# Patient Record
Sex: Male | Born: 1945 | Race: White | Hispanic: No | Marital: Married | Smoking: Never smoker
Health system: Southern US, Community
[De-identification: ages and names within clinical notes are randomized; demographics above are authoritative.]

## PROBLEM LIST (undated history)

## (undated) DIAGNOSIS — E785 Hyperlipidemia, unspecified: Secondary | ICD-10-CM

## (undated) DIAGNOSIS — Z9889 Other specified postprocedural states: Secondary | ICD-10-CM

## (undated) DIAGNOSIS — I10 Essential (primary) hypertension: Secondary | ICD-10-CM

## (undated) DIAGNOSIS — I2510 Atherosclerotic heart disease of native coronary artery without angina pectoris: Secondary | ICD-10-CM

## (undated) DIAGNOSIS — M545 Low back pain, unspecified: Secondary | ICD-10-CM

## (undated) DIAGNOSIS — R112 Nausea with vomiting, unspecified: Secondary | ICD-10-CM

## (undated) DIAGNOSIS — M1990 Unspecified osteoarthritis, unspecified site: Secondary | ICD-10-CM

## (undated) DIAGNOSIS — R7303 Prediabetes: Secondary | ICD-10-CM

## (undated) DIAGNOSIS — N529 Male erectile dysfunction, unspecified: Secondary | ICD-10-CM

## (undated) HISTORY — PX: TONSILLECTOMY: SUR1361

## (undated) HISTORY — PX: INGUINAL HERNIA REPAIR: SUR1180

## (undated) HISTORY — DX: Essential (primary) hypertension: I10

## (undated) HISTORY — PX: CORONARY ARTERY BYPASS GRAFT: SHX141

## (undated) HISTORY — DX: Male erectile dysfunction, unspecified: N52.9

## (undated) HISTORY — DX: Atherosclerotic heart disease of native coronary artery without angina pectoris: I25.10

## (undated) HISTORY — PX: LUMBAR DISC SURGERY: SHX700

## (undated) HISTORY — PX: CARDIAC SURGERY: SHX584

---

## 2001-06-23 ENCOUNTER — Ambulatory Visit (HOSPITAL_COMMUNITY): Admission: RE | Admit: 2001-06-23 | Discharge: 2001-06-24 | Payer: Self-pay | Admitting: Cardiology

## 2001-07-11 ENCOUNTER — Encounter (HOSPITAL_COMMUNITY): Admission: RE | Admit: 2001-07-11 | Discharge: 2001-10-09 | Payer: Self-pay | Admitting: Cardiology

## 2001-10-09 ENCOUNTER — Inpatient Hospital Stay (HOSPITAL_COMMUNITY): Admission: EM | Admit: 2001-10-09 | Discharge: 2001-10-18 | Payer: Self-pay | Admitting: Emergency Medicine

## 2001-10-11 ENCOUNTER — Encounter: Payer: Self-pay | Admitting: Cardiothoracic Surgery

## 2001-10-12 ENCOUNTER — Encounter: Payer: Self-pay | Admitting: Cardiothoracic Surgery

## 2001-10-13 ENCOUNTER — Encounter: Payer: Self-pay | Admitting: Cardiothoracic Surgery

## 2001-10-14 ENCOUNTER — Encounter: Payer: Self-pay | Admitting: Cardiothoracic Surgery

## 2001-10-15 ENCOUNTER — Encounter: Payer: Self-pay | Admitting: Cardiothoracic Surgery

## 2001-10-17 ENCOUNTER — Encounter: Payer: Self-pay | Admitting: Cardiothoracic Surgery

## 2001-11-28 ENCOUNTER — Encounter (HOSPITAL_COMMUNITY): Admission: RE | Admit: 2001-11-28 | Discharge: 2002-02-26 | Payer: Self-pay | Admitting: Cardiology

## 2004-10-08 ENCOUNTER — Encounter: Admission: RE | Admit: 2004-10-08 | Discharge: 2004-10-08 | Payer: Self-pay | Admitting: Internal Medicine

## 2004-11-02 ENCOUNTER — Ambulatory Visit (HOSPITAL_COMMUNITY): Admission: RE | Admit: 2004-11-02 | Discharge: 2004-11-02 | Payer: Self-pay | Admitting: Neurological Surgery

## 2012-07-12 ENCOUNTER — Emergency Department (HOSPITAL_COMMUNITY): Payer: Medicare Other

## 2012-07-12 ENCOUNTER — Inpatient Hospital Stay (HOSPITAL_COMMUNITY)
Admission: EM | Admit: 2012-07-12 | Discharge: 2012-07-18 | DRG: 551 | Disposition: A | Discharge status: Rehabilitation Facility | Payer: Medicare Other | Attending: General Surgery | Admitting: General Surgery

## 2012-07-12 ENCOUNTER — Encounter (HOSPITAL_COMMUNITY): Payer: Self-pay

## 2012-07-12 DIAGNOSIS — W19XXXA Unspecified fall, initial encounter: Secondary | ICD-10-CM

## 2012-07-12 DIAGNOSIS — S2249XA Multiple fractures of ribs, unspecified side, initial encounter for closed fracture: Secondary | ICD-10-CM | POA: Diagnosis present

## 2012-07-12 DIAGNOSIS — S066X9A Traumatic subarachnoid hemorrhage with loss of consciousness of unspecified duration, initial encounter: Secondary | ICD-10-CM | POA: Diagnosis present

## 2012-07-12 DIAGNOSIS — S065XAA Traumatic subdural hemorrhage with loss of consciousness status unknown, initial encounter: Secondary | ICD-10-CM | POA: Diagnosis present

## 2012-07-12 DIAGNOSIS — S066XAA Traumatic subarachnoid hemorrhage with loss of consciousness status unknown, initial encounter: Secondary | ICD-10-CM | POA: Diagnosis present

## 2012-07-12 DIAGNOSIS — S0291XA Unspecified fracture of skull, initial encounter for closed fracture: Secondary | ICD-10-CM

## 2012-07-12 DIAGNOSIS — S27329A Contusion of lung, unspecified, initial encounter: Secondary | ICD-10-CM

## 2012-07-12 DIAGNOSIS — I609 Nontraumatic subarachnoid hemorrhage, unspecified: Secondary | ICD-10-CM

## 2012-07-12 DIAGNOSIS — I2510 Atherosclerotic heart disease of native coronary artery without angina pectoris: Secondary | ICD-10-CM | POA: Diagnosis present

## 2012-07-12 DIAGNOSIS — S32029A Unspecified fracture of second lumbar vertebra, initial encounter for closed fracture: Secondary | ICD-10-CM | POA: Diagnosis present

## 2012-07-12 DIAGNOSIS — K560 Paralytic ileus: Secondary | ICD-10-CM | POA: Diagnosis present

## 2012-07-12 DIAGNOSIS — S42102A Fracture of unspecified part of scapula, left shoulder, initial encounter for closed fracture: Secondary | ICD-10-CM | POA: Diagnosis present

## 2012-07-12 DIAGNOSIS — S065X9A Traumatic subdural hemorrhage with loss of consciousness of unspecified duration, initial encounter: Secondary | ICD-10-CM | POA: Diagnosis present

## 2012-07-12 DIAGNOSIS — S2242XA Multiple fractures of ribs, left side, initial encounter for closed fracture: Secondary | ICD-10-CM | POA: Diagnosis present

## 2012-07-12 DIAGNOSIS — S42109A Fracture of unspecified part of scapula, unspecified shoulder, initial encounter for closed fracture: Secondary | ICD-10-CM | POA: Diagnosis present

## 2012-07-12 DIAGNOSIS — J939 Pneumothorax, unspecified: Secondary | ICD-10-CM

## 2012-07-12 DIAGNOSIS — W11XXXA Fall on and from ladder, initial encounter: Secondary | ICD-10-CM | POA: Diagnosis present

## 2012-07-12 DIAGNOSIS — E785 Hyperlipidemia, unspecified: Secondary | ICD-10-CM | POA: Diagnosis present

## 2012-07-12 DIAGNOSIS — S22009A Unspecified fracture of unspecified thoracic vertebra, initial encounter for closed fracture: Secondary | ICD-10-CM

## 2012-07-12 DIAGNOSIS — S22089A Unspecified fracture of T11-T12 vertebra, initial encounter for closed fracture: Secondary | ICD-10-CM | POA: Diagnosis present

## 2012-07-12 DIAGNOSIS — S32019A Unspecified fracture of first lumbar vertebra, initial encounter for closed fracture: Secondary | ICD-10-CM | POA: Diagnosis present

## 2012-07-12 DIAGNOSIS — S066X0A Traumatic subarachnoid hemorrhage without loss of consciousness, initial encounter: Secondary | ICD-10-CM | POA: Diagnosis present

## 2012-07-12 DIAGNOSIS — J942 Hemothorax: Secondary | ICD-10-CM

## 2012-07-12 DIAGNOSIS — I10 Essential (primary) hypertension: Secondary | ICD-10-CM | POA: Diagnosis present

## 2012-07-12 DIAGNOSIS — S32009A Unspecified fracture of unspecified lumbar vertebra, initial encounter for closed fracture: Secondary | ICD-10-CM

## 2012-07-12 HISTORY — DX: Low back pain, unspecified: M54.50

## 2012-07-12 HISTORY — DX: Essential (primary) hypertension: I10

## 2012-07-12 HISTORY — DX: Low back pain: M54.5

## 2012-07-12 HISTORY — DX: Hyperlipidemia, unspecified: E78.5

## 2012-07-12 LAB — CBC WITH DIFFERENTIAL/PLATELET
Basophils Absolute: 0 10*3/uL (ref 0.0–0.1)
Basophils Relative: 0 % (ref 0–1)
Eosinophils Absolute: 0.1 10*3/uL (ref 0.0–0.7)
Eosinophils Relative: 1 % (ref 0–5)
HCT: 39.1 % (ref 39.0–52.0)
Hemoglobin: 13.5 g/dL (ref 13.0–17.0)
Lymphocytes Relative: 16 % (ref 12–46)
Lymphs Abs: 2.4 10*3/uL (ref 0.7–4.0)
MCH: 31.1 pg (ref 26.0–34.0)
MCHC: 34.5 g/dL (ref 30.0–36.0)
MCV: 90.1 fL (ref 78.0–100.0)
Monocytes Absolute: 0.7 10*3/uL (ref 0.1–1.0)
Monocytes Relative: 5 % (ref 3–12)
Neutro Abs: 11.3 10*3/uL — ABNORMAL HIGH (ref 1.7–7.7)
Neutrophils Relative %: 78 % — ABNORMAL HIGH (ref 43–77)
Platelets: 244 10*3/uL (ref 150–400)
RBC: 4.34 MIL/uL (ref 4.22–5.81)
RDW: 12.9 % (ref 11.5–15.5)
WBC: 14.5 10*3/uL — ABNORMAL HIGH (ref 4.0–10.5)

## 2012-07-12 LAB — POCT I-STAT, CHEM 8
BUN: 13 mg/dL (ref 6–23)
Calcium, Ion: 1.21 mmol/L (ref 1.13–1.30)
Chloride: 106 mEq/L (ref 96–112)
Creatinine, Ser: 0.9 mg/dL (ref 0.50–1.35)
Glucose, Bld: 121 mg/dL — ABNORMAL HIGH (ref 70–99)
HCT: 41 % (ref 39.0–52.0)
Hemoglobin: 13.9 g/dL (ref 13.0–17.0)
Potassium: 3.9 mEq/L (ref 3.5–5.1)
Sodium: 143 mEq/L (ref 135–145)
TCO2: 24 mmol/L (ref 0–100)

## 2012-07-12 LAB — URINALYSIS, ROUTINE W REFLEX MICROSCOPIC
Bilirubin Urine: NEGATIVE
Glucose, UA: NEGATIVE mg/dL
Ketones, ur: 15 mg/dL — AB
Leukocytes, UA: NEGATIVE
Nitrite: NEGATIVE
Protein, ur: NEGATIVE mg/dL
Specific Gravity, Urine: 1.01 (ref 1.005–1.030)
Urobilinogen, UA: 0.2 mg/dL (ref 0.0–1.0)
pH: 5 (ref 5.0–8.0)

## 2012-07-12 LAB — MRSA PCR SCREENING: MRSA by PCR: NEGATIVE

## 2012-07-12 LAB — URINE MICROSCOPIC-ADD ON

## 2012-07-12 LAB — SAMPLE TO BLOOD BANK

## 2012-07-12 MED ORDER — DIPHENHYDRAMINE HCL 50 MG/ML IJ SOLN: 12.5000 mg | Freq: Four times a day (QID) | INTRAMUSCULAR | Status: DC | PRN

## 2012-07-12 MED ORDER — POTASSIUM CHLORIDE IN NACL 20-0.45 MEQ/L-% IV SOLN
INTRAVENOUS | Status: DC
  Administered 2012-07-12 – 2012-07-13 (×3): via INTRAVENOUS
  Administered 2012-07-14: 1000 mL via INTRAVENOUS
  Administered 2012-07-15: 09:00:00 via INTRAVENOUS
  Filled 2012-07-12 (×6): qty 1000

## 2012-07-12 MED ORDER — FENTANYL CITRATE 0.05 MG/ML IJ SOLN
50.0000 ug | Freq: Once | INTRAMUSCULAR | Status: AC
  Administered 2012-07-12: 14:00:00 via INTRAVENOUS
  Filled 2012-07-12: qty 2

## 2012-07-12 MED ORDER — DOCUSATE SODIUM 100 MG PO CAPS
100.0000 mg | ORAL_CAPSULE | Freq: Two times a day (BID) | ORAL | Status: DC
  Administered 2012-07-12 – 2012-07-17 (×8): 100 mg via ORAL
  Filled 2012-07-12 (×9): qty 1

## 2012-07-12 MED ORDER — LISINOPRIL 10 MG PO TABS
10.0000 mg | ORAL_TABLET | Freq: Every day | ORAL | Status: DC
  Administered 2012-07-14 – 2012-07-18 (×5): 10 mg via ORAL
  Filled 2012-07-12 (×7): qty 1

## 2012-07-12 MED ORDER — HYDROMORPHONE 0.3 MG/ML IV SOLN
INTRAVENOUS | Status: DC
  Administered 2012-07-12: 21:00:00 via INTRAVENOUS
  Administered 2012-07-13 (×3): 0.3 mg via INTRAVENOUS
  Administered 2012-07-13: 0.9 mg via INTRAVENOUS
  Administered 2012-07-13: 0.3 mg via INTRAVENOUS
  Administered 2012-07-13: 1.2 mg via INTRAVENOUS
  Administered 2012-07-13: 0.9 mg via INTRAVENOUS
  Administered 2012-07-14: 1.2 mg via INTRAVENOUS
  Administered 2012-07-14: 08:00:00 via INTRAVENOUS
  Administered 2012-07-14: 1.2 mg via INTRAVENOUS
  Administered 2012-07-14: 0.9 mg via INTRAVENOUS
  Administered 2012-07-14 (×2): 0.6 mg via INTRAVENOUS
  Administered 2012-07-14 – 2012-07-15 (×2): 0.9 mg via INTRAVENOUS
  Administered 2012-07-15: 0.6 mg via INTRAVENOUS
  Administered 2012-07-15: 1.5 mg via INTRAVENOUS
  Administered 2012-07-15: 16:00:00 via INTRAVENOUS
  Administered 2012-07-15 – 2012-07-16 (×3): 0.9 mg via INTRAVENOUS
  Filled 2012-07-12 (×3): qty 25

## 2012-07-12 MED ORDER — HYDROMORPHONE HCL PF 1 MG/ML IJ SOLN
1.0000 mg | Freq: Once | INTRAMUSCULAR | Status: AC
  Administered 2012-07-12: 1 mg via INTRAVENOUS
  Filled 2012-07-12: qty 1

## 2012-07-12 MED ORDER — NALOXONE HCL 0.4 MG/ML IJ SOLN: 0.4000 mg | INTRAMUSCULAR | Status: DC | PRN

## 2012-07-12 MED ORDER — ONDANSETRON HCL 4 MG/2ML IJ SOLN
4.0000 mg | Freq: Four times a day (QID) | INTRAMUSCULAR | Status: DC | PRN
  Administered 2012-07-13 – 2012-07-16 (×3): 4 mg via INTRAVENOUS
  Filled 2012-07-12 (×2): qty 2

## 2012-07-12 MED ORDER — ONDANSETRON HCL 4 MG/2ML IJ SOLN
INTRAMUSCULAR | Status: AC
  Filled 2012-07-12: qty 2

## 2012-07-12 MED ORDER — ATORVASTATIN CALCIUM 40 MG PO TABS
40.0000 mg | ORAL_TABLET | Freq: Every day | ORAL | Status: DC
  Administered 2012-07-13 – 2012-07-17 (×5): 40 mg via ORAL
  Filled 2012-07-12 (×6): qty 1

## 2012-07-12 MED ORDER — SODIUM CHLORIDE 0.9 % IJ SOLN: 9.0000 mL | INTRAMUSCULAR | Status: DC | PRN

## 2012-07-12 MED ORDER — IOHEXOL 300 MG/ML  SOLN
100.0000 mL | Freq: Once | INTRAMUSCULAR | Status: AC | PRN
  Administered 2012-07-12: 100 mL via INTRAVENOUS

## 2012-07-12 MED ORDER — QUINAPRIL HCL 10 MG PO TABS: 10.0000 mg | ORAL_TABLET | Freq: Every day | ORAL | Status: DC

## 2012-07-12 MED ORDER — SODIUM CHLORIDE 0.9 % IV BOLUS (SEPSIS)
500.0000 mL | Freq: Once | INTRAVENOUS | Status: AC
  Administered 2012-07-12: 500 mL via INTRAVENOUS

## 2012-07-12 MED ORDER — DIPHENHYDRAMINE HCL 12.5 MG/5ML PO ELIX
12.5000 mg | ORAL_SOLUTION | Freq: Four times a day (QID) | ORAL | Status: DC | PRN
  Administered 2012-07-18: 12.5 mg via ORAL
  Filled 2012-07-12: qty 10
  Filled 2012-07-12: qty 5

## 2012-07-12 MED ORDER — VITAMIN E 180 MG (400 UNIT) PO CAPS
400.0000 [IU] | ORAL_CAPSULE | Freq: Every day | ORAL | Status: DC
  Administered 2012-07-13 – 2012-07-17 (×3): 400 [IU] via ORAL
  Filled 2012-07-12 (×6): qty 1

## 2012-07-12 MED ORDER — ONDANSETRON HCL 4 MG/2ML IJ SOLN
4.0000 mg | Freq: Once | INTRAMUSCULAR | Status: DC
  Filled 2012-07-12: qty 2

## 2012-07-12 MED ORDER — ADULT MULTIVITAMIN W/MINERALS CH
1.0000 | ORAL_TABLET | Freq: Every day | ORAL | Status: DC
  Administered 2012-07-13 – 2012-07-18 (×5): 1 via ORAL
  Filled 2012-07-12 (×6): qty 1

## 2012-07-12 NOTE — ED Notes (Signed)
ABdominal pain and painful breathing

## 2012-07-12 NOTE — ED Notes (Signed)
Pt in radiology 

## 2012-07-12 NOTE — ED Provider Notes (Signed)
History     CSN: [id number]  Arrival date & time 07/12/12  1313   First MD Initiated Contact with Patient 07/12/12 1315      Chief Complaint  Patient presents with  . Fall    (Consider location/radiation/quality/duration/timing/severity/associated sxs/prior treatment) Patient is a 66 y.o. male presenting with fall. The history is provided by the patient and the EMS personnel.  Fall Associated symptoms include headaches. Pertinent negatives include no numbness, no abdominal pain, no nausea and no vomiting.   patient was on a ladder and fell. He does not remember the episode. He has pain in his back chest shoulder. He didn't loss consciousness. He is not on blood thinners. He has pain when he breathes. No numbness or weakness. He does have pain when moves his left arm.  Past Medical History  Diagnosis Date  . Coronary artery disease   . Hypertension   . Low back pain   . Hyperlipidemia     Past Surgical History  Procedure Date  . Cardiac surgery   . Lumbar disc surgery     History reviewed. No pertinent family history.  History  Substance Use Topics  . Smoking status: Never Smoker   . Smokeless tobacco: Not on file  . Alcohol Use: Not on file      Review of Systems  Constitutional: Negative for activity change and appetite change.  HENT: Negative for neck stiffness.   Eyes: Negative for pain.  Respiratory: Positive for shortness of breath. Negative for chest tightness.   Cardiovascular: Positive for chest pain. Negative for leg swelling.  Gastrointestinal: Negative for nausea, vomiting, abdominal pain and diarrhea.  Genitourinary: Negative for flank pain.  Musculoskeletal: Negative for back pain.  Skin: Negative for rash.  Neurological: Positive for headaches. Negative for weakness and numbness.  Psychiatric/Behavioral: Negative for behavioral problems.    Allergies  Review of patient's allergies indicates no known allergies.  Home Medications   Current  Outpatient Rx  Name Route Sig Dispense Refill  . ASPIRIN 325 MG PO TABS Oral Take 325 mg by mouth daily.    . ADULT MULTIVITAMIN W/MINERALS CH Oral Take 1 tablet by mouth daily.    . QUINAPRIL HCL 20 MG PO TABS Oral Take 10 mg by mouth daily.    Marland Kitchen SIMVASTATIN 80 MG PO TABS Oral Take 80 mg by mouth at bedtime.    Marland Kitchen VITAMIN E 400 UNITS PO CAPS Oral Take 400 Units by mouth daily.      BP 142/80  Pulse 86  Temp 97 F (36.1 C) (Oral)  Resp 18  SpO2 94%  Physical Exam  Constitutional: He appears well-developed and well-nourished.  HENT:  Head: Normocephalic.       Occipital tenderness.  Eyes: Conjunctivae normal are normal. Pupils are equal, round, and reactive to light.  Neck:       Cervical collar in place. Trachea midline.  Cardiovascular: Normal rate and regular rhythm.   Pulmonary/Chest: Effort normal. He exhibits tenderness.       Moderate tenderness to left chest wall. No subcutaneous emphysema palpated. Equal breath sounds bilaterally.  Abdominal: There is tenderness.       Tenderness to left upper abdomen. No ecchymosis.  Musculoskeletal:       Tenderness over left shoulder anteriorly and posteriorly. Tender over scapula. Range of motion intact elbow. Neurovascular intact over hand. No tenderness over lower extremity. There is tenderness over most of spine, worst in the lower back.  Skin: Skin is warm.  ED Course  Procedures (including critical care time)  Labs Reviewed  CBC WITH DIFFERENTIAL - Abnormal; Notable for the following:    WBC 14.5 (*)     Neutrophils Relative 78 (*)     Neutro Abs 11.3 (*)     All other components within normal limits  POCT I-STAT, CHEM 8 - Abnormal; Notable for the following:    Glucose, Bld 121 (*)     All other components within normal limits  SAMPLE TO BLOOD BANK  URINALYSIS, ROUTINE W REFLEX MICROSCOPIC   Ct Head Wo Contrast  07/12/2012  *RADIOLOGY REPORT*  Clinical Data:  Larey Seat from ladder  CT HEAD WITHOUT CONTRAST CT CERVICAL  SPINE WITHOUT CONTRAST  Technique:  Multidetector CT imaging of the head and cervical spine was performed following the standard protocol without intravenous contrast.  Multiplanar CT image reconstructions of the cervical spine were also generated.  Comparison:   None  CT HEAD  Findings: Small amount of subarachnoid hemorrhage in the right temporal and   parietal region.  Possible small subdural hematoma right temporal lobe.  No shift of the midline structures.  No acute infarct or mass.  Ventricle size is normal.  There is widening of the lambdoid suture on the left compatible with diastasis.  This is most likely acute.  Nondisplaced fracture of the left occipital bone extending into the foramen magnum. Chronic sinusitis is present.  IMPRESSION: Mild subarachnoid hemorrhage on the right.  Possible small subdural hematoma right temporal lobe.  No midline shift.  Fracture of the left occipital bone and diastasis of the left lambdoid suture.  Chronic sinusitis.  CT CERVICAL SPINE  Findings: Tiny left apical pneumothorax.  Normal cervical alignment.  Negative for fracture of the cervical spine.  There is prominent facet degeneration in the cervical spine, most prominent on the left at C3-4 C4-5 and C5-6.  Nondisplaced fracture left occipital bone extending into the foramen magnum.  Diastasis of the lambdoid suture on the left.  IMPRESSION: Tiny left apical pneumothorax.  Negative for cervical spine fracture  Left occipital bone fracture.   Original Report Authenticated By: Camelia Phenes, M.D.    Ct Chest W Contrast  07/12/2012  *RADIOLOGY REPORT*  Clinical Data:  Fall from ladder, left shoulder, chest and back pain  CT CHEST, ABDOMEN AND PELVIS WITH CONTRAST  Technique:  Multidetector CT imaging of the chest, abdomen and pelvis was performed following the standard protocol during bolus administration of intravenous contrast.  Contrast: OMNIPAQUE IOHEXOL 300 MG/ML  SOLN  Comparison:  07/12/2012  CT CHEST   Findings:  Left rib fractures noted involving the left fourth, fifth, sixth, ninth, tenth, and eleventh ribs.  There is also an associated diffuse minimally comminuted left scapular fracture. Scapula fracture involves the glenoid surface.  No shoulder malalignment.  Small left pneumothorax evident with scattered peripheral left upper lobe and left lower lobe atelectasis/contusion.  Right base atelectasis also evident.  No right pneumothorax.  Trachea and airways are patent.  Previous coronary bypass changes noted.  No mediastinal hemorrhage or hematoma.  Thoracic aorta appears intact.  Normal heart size. No pericardial effusion.  Left chest subcutaneous emphysema noted.  IMPRESSION: Numerous acute left rib fractures with a small left pneumothorax and peripheral left lung atelectasis/contusion.  Diffuse left scapula fracture.  Right base atelectasis as well.  CT ABDOMEN AND PELVIS  Findings:  Calcified gallstones noted within the gallbladder dependently.  Liver, biliary system, pancreas, spleen, adrenal glands, and kidneys are within normal limits  for age and demonstrate no acute finding.  Negative for bowel obstruction, dilatation, ileus pattern, free air, or central mesenteric abnormality.  No abdominal free fluid, fluid collection, hemorrhage, adenopathy, or abscess.  Scattered colonic diverticulosis.  Atherosclerosis of the aorta without aneurysm or dissection.  Pelvis:  Sigmoid diverticulosis evident.  No pelvic free fluid, hemorrhage, hematoma, adenopathy, inguinal abnormality, hernia. Urinary bladder unremarkable.  Osseous structures:  Acute superior endplate mild compression fractures noted at T11, T12, L1, and L2.  Left transverse process fractures at L1 and L2.  Posterior spinous process fractures at T11, T12 and L1.  Posterior lamina and facet fractures at T12 and L1.  These are best appreciated on the sagittal reconstructions. Facets appear aligned.  No malalignment, subluxation or dislocation.  No  significant retropulsion or canal narrowing. Pelvis appears intact.  Hips are located.  IMPRESSION: T11, T12, L1, and L2 spinal fractures as described.  Cholelithiasis  No acute intra-abdominal or pelvic injury.  Diverticulosis  Critical Value/emergent results were called by telephone at the time of interpretation on 07/12/2012 at 3:30 p.m. to Dr.Jemel Ono, who verbally acknowledged these results.   Original Report Authenticated By: Judie Petit. Ruel Favors, M.D.    Ct Cervical Spine Wo Contrast  07/12/2012  *RADIOLOGY REPORT*  Clinical Data:  Larey Seat from ladder  CT HEAD WITHOUT CONTRAST CT CERVICAL SPINE WITHOUT CONTRAST  Technique:  Multidetector CT imaging of the head and cervical spine was performed following the standard protocol without intravenous contrast.  Multiplanar CT image reconstructions of the cervical spine were also generated.  Comparison:   None  CT HEAD  Findings: Small amount of subarachnoid hemorrhage in the right temporal and   parietal region.  Possible small subdural hematoma right temporal lobe.  No shift of the midline structures.  No acute infarct or mass.  Ventricle size is normal.  There is widening of the lambdoid suture on the left compatible with diastasis.  This is most likely acute.  Nondisplaced fracture of the left occipital bone extending into the foramen magnum. Chronic sinusitis is present.  IMPRESSION: Mild subarachnoid hemorrhage on the right.  Possible small subdural hematoma right temporal lobe.  No midline shift.  Fracture of the left occipital bone and diastasis of the left lambdoid suture.  Chronic sinusitis.  CT CERVICAL SPINE  Findings: Tiny left apical pneumothorax.  Normal cervical alignment.  Negative for fracture of the cervical spine.  There is prominent facet degeneration in the cervical spine, most prominent on the left at C3-4 C4-5 and C5-6.  Nondisplaced fracture left occipital bone extending into the foramen magnum.  Diastasis of the lambdoid suture on the left.   IMPRESSION: Tiny left apical pneumothorax.  Negative for cervical spine fracture  Left occipital bone fracture.   Original Report Authenticated By: Camelia Phenes, M.D.    Ct Abdomen Pelvis W Contrast  07/12/2012  *RADIOLOGY REPORT*  Clinical Data:  Fall from ladder, left shoulder, chest and back pain  CT CHEST, ABDOMEN AND PELVIS WITH CONTRAST  Technique:  Multidetector CT imaging of the chest, abdomen and pelvis was performed following the standard protocol during bolus administration of intravenous contrast.  Contrast: OMNIPAQUE IOHEXOL 300 MG/ML  SOLN  Comparison:  07/12/2012  CT CHEST  Findings:  Left rib fractures noted involving the left fourth, fifth, sixth, ninth, tenth, and eleventh ribs.  There is also an associated diffuse minimally comminuted left scapular fracture. Scapula fracture involves the glenoid surface.  No shoulder malalignment.  Small left pneumothorax evident with scattered peripheral  left upper lobe and left lower lobe atelectasis/contusion.  Right base atelectasis also evident.  No right pneumothorax.  Trachea and airways are patent.  Previous coronary bypass changes noted.  No mediastinal hemorrhage or hematoma.  Thoracic aorta appears intact.  Normal heart size. No pericardial effusion.  Left chest subcutaneous emphysema noted.  IMPRESSION: Numerous acute left rib fractures with a small left pneumothorax and peripheral left lung atelectasis/contusion.  Diffuse left scapula fracture.  Right base atelectasis as well.  CT ABDOMEN AND PELVIS  Findings:  Calcified gallstones noted within the gallbladder dependently.  Liver, biliary system, pancreas, spleen, adrenal glands, and kidneys are within normal limits for age and demonstrate no acute finding.  Negative for bowel obstruction, dilatation, ileus pattern, free air, or central mesenteric abnormality.  No abdominal free fluid, fluid collection, hemorrhage, adenopathy, or abscess.  Scattered colonic diverticulosis.  Atherosclerosis  of the aorta without aneurysm or dissection.  Pelvis:  Sigmoid diverticulosis evident.  No pelvic free fluid, hemorrhage, hematoma, adenopathy, inguinal abnormality, hernia. Urinary bladder unremarkable.  Osseous structures:  Acute superior endplate mild compression fractures noted at T11, T12, L1, and L2.  Left transverse process fractures at L1 and L2.  Posterior spinous process fractures at T11, T12 and L1.  Posterior lamina and facet fractures at T12 and L1.  These are best appreciated on the sagittal reconstructions. Facets appear aligned.  No malalignment, subluxation or dislocation.  No significant retropulsion or canal narrowing. Pelvis appears intact.  Hips are located.  IMPRESSION: T11, T12, L1, and L2 spinal fractures as described.  Cholelithiasis  No acute intra-abdominal or pelvic injury.  Diverticulosis  Critical Value/emergent results were called by telephone at the time of interpretation on 07/12/2012 at 3:30 p.m. to Dr.Randon Somera, who verbally acknowledged these results.   Original Report Authenticated By: Judie Petit. Ruel Favors, M.D.    Dg Chest Port 1 View  07/12/2012  **ADDENDUM** CREATED: 07/12/2012 15:38:13  There is mild displaced fracture of the left acromion.  Question nondisplaced fracture of the left scapula.  **END ADDENDUM** SIGNED BY: Natasha Mead, M.D.   07/12/2012  *RADIOLOGY REPORT*  Clinical Data: Fall  PORTABLE CHEST - 1 VIEW  Comparison: 01/21/2011  Findings: Study is limited by poor inspiration.  Status post CABG again noted.  Displaced fracture or of the left fourth fifth and sixth ribs.  No diagnostic pneumothorax.  Mild left basilar atelectasis or lung contusion. Probable calcified gallstones in the right upper quadrant of the abdomen.  IMPRESSION: Displaced fracture of the left fourth fifth and sixth ribs.  Mild left basilar atelectasis or lung contusion.  Status post CABG.   Original Report Authenticated By: Natasha Mead, M.D.    Dg Shoulder Left  07/12/2012  *RADIOLOGY REPORT*   Clinical Data: Post fall  LEFT SHOULDER - 2+ VIEW  Comparison: Chest x-rays same day  Findings: Mild displaced fracture of the left fourth fifth and fifth ribs.  Minimal displaced fracture of the left scapula.  Mild displaced fracture of the left acromion.  IMPRESSION: Mild displaced fracture of the left fourth fifth and fifth ribs. Minimal displaced fracture of the left scapula.  Mild displaced fracture of the left acromion.   Original Report Authenticated By: Natasha Mead, M.D.      1. Fall   2. Multiple rib fractures   3. Pneumothorax   4. Hemothorax   5. Pulmonary contusion   6. Lumbar vertebral fracture   7. Thoracic spine fracture   8. Skull fracture   9. Subarachnoid hemorrhage  10. Subdural hematoma   11. Left scapula fracture       MDM  Patient with a fall. Vitals appear reassuring at this time. Initial chest x-ray showed 3 rib fractures without clear pneumothorax. Surgery was consult and after the x-ray and saw the patient in ER. Chest CTs were done and showed multiple injuries. The skull fracture subdural hematoma and sub-arachnoid hemorrhage. There is possibly unstable lumbar and thoracic fracture. There is also multiple rib fractures and small hemopneumothorax. Is also pulmonary contusion. He'll be admitted to trauma surgery.        Juliet Rude. Rubin Payor, MD 07/12/12 1622

## 2012-07-12 NOTE — H&P (Signed)
Rodney Riley is an 66 y.o. male.   Chief Complaint: Fall HPI: Rodney Riley was on a ladder cleaning his gutters when he fell approximately 10-12 feet. He denies LOC. He is not amnestic to the event. He complains of left shoulder pain and back pain.  Past Medical History  Diagnosis Date  . Coronary artery disease   . Hypertension   . Low back pain   . Hyperlipidemia     Past Surgical History  Procedure Date  . Cardiac surgery   . Lumbar disc surgery     History reviewed. No pertinent family history. Social History:  reports that he has never smoked. He does not have any smokeless tobacco history on file. He drinks socially and denies drug use.  Allergies: No Known Allergies    Results for orders placed during the hospital encounter of 07/12/12 (from the past 48 hour(s))  CBC WITH DIFFERENTIAL     Status: Abnormal   Collection Time   07/12/12  1:35 PM      Component Value Range Comment   WBC 14.5 (*) 4.0 - 10.5 K/uL    RBC 4.34  4.22 - 5.81 MIL/uL    Hemoglobin 13.5  13.0 - 17.0 g/dL    HCT 16.1  09.6 - 04.5 %    MCV 90.1  78.0 - 100.0 fL    MCH 31.1  26.0 - 34.0 pg    MCHC 34.5  30.0 - 36.0 g/dL    RDW 40.9  81.1 - 91.4 %    Platelets 244  150 - 400 K/uL    Neutrophils Relative 78 (*) 43 - 77 %    Neutro Abs 11.3 (*) 1.7 - 7.7 K/uL    Lymphocytes Relative 16  12 - 46 %    Lymphs Abs 2.4  0.7 - 4.0 K/uL    Monocytes Relative 5  3 - 12 %    Monocytes Absolute 0.7  0.1 - 1.0 K/uL    Eosinophils Relative 1  0 - 5 %    Eosinophils Absolute 0.1  0.0 - 0.7 K/uL    Basophils Relative 0  0 - 1 %    Basophils Absolute 0.0  0.0 - 0.1 K/uL   SAMPLE TO BLOOD BANK     Status: Normal   Collection Time   07/12/12  1:45 PM      Component Value Range Comment   Blood Bank Specimen SAMPLE AVAILABLE FOR TESTING      Sample Expiration 07/13/2012     POCT I-STAT, CHEM 8     Status: Abnormal   Collection Time   07/12/12  2:09 PM      Component Value Range Comment   Sodium 143  135  - 145 mEq/L    Potassium 3.9  3.5 - 5.1 mEq/L    Chloride 106  96 - 112 mEq/L    BUN 13  6 - 23 mg/dL    Creatinine, Ser 7.82  0.50 - 1.35 mg/dL    Glucose, Bld 956 (*) 70 - 99 mg/dL    Calcium, Ion 2.13  0.86 - 1.30 mmol/L    TCO2 24  0 - 100 mmol/L    Hemoglobin 13.9  13.0 - 17.0 g/dL    HCT 57.8  46.9 - 62.9 %    Dg Chest Port 1 View  07/12/2012  *RADIOLOGY REPORT*  Clinical Data: Fall  PORTABLE CHEST - 1 VIEW  Comparison: 01/21/2011  Findings: Study is limited by poor inspiration.  Status  post CABG again noted.  Displaced fracture or of the left fourth fifth and sixth ribs.  No diagnostic pneumothorax.  Mild left basilar atelectasis or lung contusion. Probable calcified gallstones in the right upper quadrant of the abdomen.  IMPRESSION: Displaced fracture of the left fourth fifth and sixth ribs.  Mild left basilar atelectasis or lung contusion.  Status post CABG.   Original Report Authenticated By: Natasha Mead, M.D.     Review of Systems  Constitutional: Negative for weight loss.  HENT: Negative for hearing loss, ear pain, neck pain, tinnitus and ear discharge.   Eyes: Negative for blurred vision, double vision, photophobia and pain.  Respiratory: Negative for cough, sputum production and shortness of breath.   Cardiovascular: Negative for chest pain.  Gastrointestinal: Negative for nausea, vomiting and abdominal pain.  Genitourinary: Negative for dysuria, urgency, frequency and flank pain.  Musculoskeletal: Positive for back pain and joint pain (Left shoulder). Negative for myalgias and falls.  Neurological: Negative for dizziness, tingling, sensory change, focal weakness, loss of consciousness and headaches.  Endo/Heme/Allergies: Does not bruise/bleed easily.  Psychiatric/Behavioral: Negative for depression, memory loss and substance abuse. The patient is not nervous/anxious.     Blood pressure 142/80, pulse 86, temperature 97 F (36.1 C), temperature source Oral, resp. rate 18,  SpO2 94.00%. Physical Exam  Vitals reviewed. Constitutional: He is oriented to person, place, and time. He appears well-developed and well-nourished. He is cooperative. No distress. Cervical collar and nasal cannula in place.  HENT:  Head: Normocephalic and atraumatic. Head is without raccoon's eyes, without Battle's sign, without abrasion, without contusion and without laceration.  Right Ear: Hearing, tympanic membrane, external ear and ear canal normal. No lacerations. No drainage or tenderness. No foreign bodies. Tympanic membrane is not perforated. No hemotympanum.  Left Ear: Hearing, tympanic membrane, external ear and ear canal normal. No lacerations. No drainage or tenderness. No foreign bodies. Tympanic membrane is not perforated. No hemotympanum.  Nose: Nose normal. No nose lacerations, sinus tenderness, nasal deformity or nasal septal hematoma. No epistaxis.  Mouth/Throat: Uvula is midline, oropharynx is clear and moist and mucous membranes are normal. No lacerations. No oropharyngeal exudate.  Eyes: Conjunctivae normal, EOM and lids are normal. Pupils are equal, round, and reactive to light. Right eye exhibits no discharge. Left eye exhibits no discharge. No scleral icterus.  Neck: Trachea normal. No JVD present. No spinous process tenderness and no muscular tenderness present. Carotid bruit is not present. No tracheal deviation present. No thyromegaly present.  Cardiovascular: Normal rate, regular rhythm, normal heart sounds, intact distal pulses and normal pulses.  Exam reveals no gallop and no friction rub.   No murmur heard. Respiratory: Effort normal and breath sounds normal. No respiratory distress. He has no wheezes. He has no rales. He exhibits no bony tenderness, no laceration and no crepitus.  GI: Soft. Normal appearance and bowel sounds are normal. He exhibits no distension. There is no tenderness. There is no rigidity, no rebound, no guarding and no CVA tenderness.    Musculoskeletal: Normal range of motion. He exhibits no edema and no tenderness.       Left shoulder: He exhibits tenderness, bony tenderness and pain.       Thoracic back: He exhibits bony tenderness.  Lymphadenopathy:    He has no cervical adenopathy.  Neurological: He is alert and oriented to person, place, and time. He has normal strength. No cranial nerve deficit or sensory deficit. GCS eye subscore is 4. GCS verbal subscore is 5. GCS  motor subscore is 6.  Skin: Skin is warm, dry and intact. He is not diaphoretic.  Psychiatric: He has a normal mood and affect. His speech is normal and behavior is normal.     Assessment/Plan Fall Multiple left rib fxs T12 endplate fx L1-2 comp fxs Mult TVP/SP fxs Left scap fx SDH -- Mild  Admit to trauma. Handy to see for shoulder, likely non-operative Nudelman to see for NS, bedrest x3d then TLSO   Freeman Caldron, PA-C Pager: [phone] General Trauma PA Pager: [phone]  07/12/2012, 2:19 PM

## 2012-07-12 NOTE — ED Notes (Signed)
Fall from 12-15 feet from ladder, ems reports pt followed commands, new who he was, but no memory of incidence or time, date for about ten minutes. Complains of severe left shoulder pain. Pt doesn't remember incident.

## 2012-07-12 NOTE — Consult Note (Signed)
Reason for Consult:  1) head injury  2) multiple thoracolumbar fractures Referring Physician:  Dr. Link Snuffer is an 66 y.o. male.   HPI: Patient is a 66 year old white male who was up on a ladder, approximately 10-12 feet, cleaning his gutters, who explain to the ladder slipped and he fell. Patient was brought to the [facility] emergency room for evaluation and has been found to have a number of injuries including a head injury, a left scapular fracture, and fractures of the T11, T12, L1, L2 vertebra. Neurosurgical consultation was requested regarding both his head injury and his multiple spinal fractures. The patient is being admitted to the trauma surgical service, and we will follow as a Research scientist (medical).   Symptomatically the patient complains of the greatest amount of pain being in the area of the left scapula and shoulder, although he does describe some aching discomfort in the thoracolumbar junction region. He does not describe any numbness, paresthesias, or weakness in the lower extremities.  He does have a history of previous lumbar surgeries, 30 years ago in [place] at the L4-5 level, and 8-10 years ago by Dr. Barnett Abu at the L5-S1 level.  CT scan of the head shows question of diastases of the left lambdoid suture with possible occipital fracture, nondisplaced. It also shows a small right hemispheric subdural hematoma, with small amount of traumatic subarachnoid hemorrhage. There is no mass effect or shift.  CT scan of the abdomen and pelvis was reviewed as regards the thoracolumbar spine. We see spinous process fractures, nondisplaced, of T11, T12, and L1. There is also multiple compression fractures: T12 (minimal), L1 (mild), and L2 (minimal to mild). We do not see any significant angulation, or encroachment of the spinal canal. We also see degenerative changes in the lower lumbar spine, particularly at the L4-5 level.  Past Medical History:  Past Medical  History  Diagnosis Date  . Coronary artery disease   . Hypertension   . Low back pain   . Hyperlipidemia     Past Surgical History:  Past Surgical History  Procedure Date  . Cardiac surgery   . Lumbar disc surgery     Family History: History reviewed. No pertinent family history.  Social History:  reports that he has never smoked. He does not have any smokeless tobacco history on file. His alcohol and drug histories not on file.  Allergies: No Known Allergies  Medications: I have reviewed the patient's current medications.  Review of systems: Notable for those difficulties described in his history of present illness and past medical history, but is otherwise unremarkable.  Results for orders placed during the hospital encounter of 07/12/12 (from the past 48 hour(s))  CBC WITH DIFFERENTIAL     Status: Abnormal   Collection Time   07/12/12  1:35 PM      Component Value Range Comment   WBC 14.5 (*) 4.0 - 10.5 K/uL    RBC 4.34  4.22 - 5.81 MIL/uL    Hemoglobin 13.5  13.0 - 17.0 g/dL    HCT 46.9  62.9 - 52.8 %    MCV 90.1  78.0 - 100.0 fL    MCH 31.1  26.0 - 34.0 pg    MCHC 34.5  30.0 - 36.0 g/dL    RDW 41.3  24.4 - 01.0 %    Platelets 244  150 - 400 K/uL    Neutrophils Relative 78 (*) 43 - 77 %    Neutro Abs  11.3 (*) 1.7 - 7.7 K/uL    Lymphocytes Relative 16  12 - 46 %    Lymphs Abs 2.4  0.7 - 4.0 K/uL    Monocytes Relative 5  3 - 12 %    Monocytes Absolute 0.7  0.1 - 1.0 K/uL    Eosinophils Relative 1  0 - 5 %    Eosinophils Absolute 0.1  0.0 - 0.7 K/uL    Basophils Relative 0  0 - 1 %    Basophils Absolute 0.0  0.0 - 0.1 K/uL   SAMPLE TO BLOOD BANK     Status: Normal   Collection Time   07/12/12  1:45 PM      Component Value Range Comment   Blood Bank Specimen SAMPLE AVAILABLE FOR TESTING      Sample Expiration 07/13/2012     POCT I-STAT, CHEM 8     Status: Abnormal   Collection Time   07/12/12  2:09 PM      Component Value Range Comment   Sodium 143  135 -  145 mEq/L    Potassium 3.9  3.5 - 5.1 mEq/L    Chloride 106  96 - 112 mEq/L    BUN 13  6 - 23 mg/dL    Creatinine, Ser 4.09  0.50 - 1.35 mg/dL    Glucose, Bld 811 (*) 70 - 99 mg/dL    Calcium, Ion 9.14  7.82 - 1.30 mmol/L    TCO2 24  0 - 100 mmol/L    Hemoglobin 13.9  13.0 - 17.0 g/dL    HCT 95.6  21.3 - 08.6 %   URINALYSIS, ROUTINE W REFLEX MICROSCOPIC     Status: Abnormal   Collection Time   07/12/12  5:28 PM      Component Value Range Comment   Color, Urine YELLOW  YELLOW    APPearance CLEAR  CLEAR    Specific Gravity, Urine 1.010  1.005 - 1.030    pH 5.0  5.0 - 8.0    Glucose, UA NEGATIVE  NEGATIVE mg/dL    Hgb urine dipstick TRACE (*) NEGATIVE    Bilirubin Urine NEGATIVE  NEGATIVE    Ketones, ur 15 (*) NEGATIVE mg/dL    Protein, ur NEGATIVE  NEGATIVE mg/dL    Urobilinogen, UA 0.2  0.0 - 1.0 mg/dL    Nitrite NEGATIVE  NEGATIVE    Leukocytes, UA NEGATIVE  NEGATIVE   URINE MICROSCOPIC-ADD ON     Status: Abnormal   Collection Time   07/12/12  5:28 PM      Component Value Range Comment   Squamous Epithelial / LPF RARE  RARE    WBC, UA 0-2  <3 WBC/hpf    RBC / HPF 0-2  <3 RBC/hpf    Casts GRANULAR CAST (*) NEGATIVE     Ct Head Wo Contrast  07/12/2012  *RADIOLOGY REPORT*  Clinical Data:  Larey Seat from ladder  CT HEAD WITHOUT CONTRAST CT CERVICAL SPINE WITHOUT CONTRAST  Technique:  Multidetector CT imaging of the head and cervical spine was performed following the standard protocol without intravenous contrast.  Multiplanar CT image reconstructions of the cervical spine were also generated.  Comparison:   None  CT HEAD  Findings: Small amount of subarachnoid hemorrhage in the right temporal and   parietal region.  Possible small subdural hematoma right temporal lobe.  No shift of the midline structures.  No acute infarct or mass.  Ventricle size is normal.  There is widening of the lambdoid  suture on the left compatible with diastasis.  This is most likely acute.  Nondisplaced fracture  of the left occipital bone extending into the foramen magnum. Chronic sinusitis is present.  IMPRESSION: Mild subarachnoid hemorrhage on the right.  Possible small subdural hematoma right temporal lobe.  No midline shift.  Fracture of the left occipital bone and diastasis of the left lambdoid suture.  Chronic sinusitis.  CT CERVICAL SPINE  Findings: Tiny left apical pneumothorax.  Normal cervical alignment.  Negative for fracture of the cervical spine.  There is prominent facet degeneration in the cervical spine, most prominent on the left at C3-4 C4-5 and C5-6.  Nondisplaced fracture left occipital bone extending into the foramen magnum.  Diastasis of the lambdoid suture on the left.  IMPRESSION: Tiny left apical pneumothorax.  Negative for cervical spine fracture  Left occipital bone fracture.   Original Report Authenticated By: Camelia Phenes, M.D.    Ct Chest W Contrast  07/12/2012  *RADIOLOGY REPORT*  Clinical Data:  Fall from ladder, left shoulder, chest and back pain  CT CHEST, ABDOMEN AND PELVIS WITH CONTRAST  Technique:  Multidetector CT imaging of the chest, abdomen and pelvis was performed following the standard protocol during bolus administration of intravenous contrast.  Contrast: OMNIPAQUE IOHEXOL 300 MG/ML  SOLN  Comparison:  07/12/2012  CT CHEST  Findings:  Left rib fractures noted involving the left fourth, fifth, sixth, ninth, tenth, and eleventh ribs.  There is also an associated diffuse minimally comminuted left scapular fracture. Scapula fracture involves the glenoid surface.  No shoulder malalignment.  Small left pneumothorax evident with scattered peripheral left upper lobe and left lower lobe atelectasis/contusion.  Right base atelectasis also evident.  No right pneumothorax.  Trachea and airways are patent.  Previous coronary bypass changes noted.  No mediastinal hemorrhage or hematoma.  Thoracic aorta appears intact.  Normal heart size. No pericardial effusion.  Left chest  subcutaneous emphysema noted.  IMPRESSION: Numerous acute left rib fractures with a small left pneumothorax and peripheral left lung atelectasis/contusion.  Diffuse left scapula fracture.  Right base atelectasis as well.  CT ABDOMEN AND PELVIS  Findings:  Calcified gallstones noted within the gallbladder dependently.  Liver, biliary system, pancreas, spleen, adrenal glands, and kidneys are within normal limits for age and demonstrate no acute finding.  Negative for bowel obstruction, dilatation, ileus pattern, free air, or central mesenteric abnormality.  No abdominal free fluid, fluid collection, hemorrhage, adenopathy, or abscess.  Scattered colonic diverticulosis.  Atherosclerosis of the aorta without aneurysm or dissection.  Pelvis:  Sigmoid diverticulosis evident.  No pelvic free fluid, hemorrhage, hematoma, adenopathy, inguinal abnormality, hernia. Urinary bladder unremarkable.  Osseous structures:  Acute superior endplate mild compression fractures noted at T11, T12, L1, and L2.  Left transverse process fractures at L1 and L2.  Posterior spinous process fractures at T11, T12 and L1.  Posterior lamina and facet fractures at T12 and L1.  These are best appreciated on the sagittal reconstructions. Facets appear aligned.  No malalignment, subluxation or dislocation.  No significant retropulsion or canal narrowing. Pelvis appears intact.  Hips are located.  IMPRESSION: T11, T12, L1, and L2 spinal fractures as described.  Cholelithiasis  No acute intra-abdominal or pelvic injury.  Diverticulosis  Critical Value/emergent results were called by telephone at the time of interpretation on 07/12/2012 at 3:30 p.m. to Dr.Pickering, who verbally acknowledged these results.   Original Report Authenticated By: Judie Petit. Ruel Favors, M.D.    Ct Cervical Spine Wo Contrast  07/12/2012  *  RADIOLOGY REPORT*  Clinical Data:  Larey Seat from ladder  CT HEAD WITHOUT CONTRAST CT CERVICAL SPINE WITHOUT CONTRAST  Technique:  Multidetector CT  imaging of the head and cervical spine was performed following the standard protocol without intravenous contrast.  Multiplanar CT image reconstructions of the cervical spine were also generated.  Comparison:   None  CT HEAD  Findings: Small amount of subarachnoid hemorrhage in the right temporal and   parietal region.  Possible small subdural hematoma right temporal lobe.  No shift of the midline structures.  No acute infarct or mass.  Ventricle size is normal.  There is widening of the lambdoid suture on the left compatible with diastasis.  This is most likely acute.  Nondisplaced fracture of the left occipital bone extending into the foramen magnum. Chronic sinusitis is present.  IMPRESSION: Mild subarachnoid hemorrhage on the right.  Possible small subdural hematoma right temporal lobe.  No midline shift.  Fracture of the left occipital bone and diastasis of the left lambdoid suture.  Chronic sinusitis.  CT CERVICAL SPINE  Findings: Tiny left apical pneumothorax.  Normal cervical alignment.  Negative for fracture of the cervical spine.  There is prominent facet degeneration in the cervical spine, most prominent on the left at C3-4 C4-5 and C5-6.  Nondisplaced fracture left occipital bone extending into the foramen magnum.  Diastasis of the lambdoid suture on the left.  IMPRESSION: Tiny left apical pneumothorax.  Negative for cervical spine fracture  Left occipital bone fracture.   Original Report Authenticated By: Camelia Phenes, M.D.    Ct Abdomen Pelvis W Contrast  07/12/2012  *RADIOLOGY REPORT*  Clinical Data:  Fall from ladder, left shoulder, chest and back pain  CT CHEST, ABDOMEN AND PELVIS WITH CONTRAST  Technique:  Multidetector CT imaging of the chest, abdomen and pelvis was performed following the standard protocol during bolus administration of intravenous contrast.  Contrast: OMNIPAQUE IOHEXOL 300 MG/ML  SOLN  Comparison:  07/12/2012  CT CHEST  Findings:  Left rib fractures noted involving  the left fourth, fifth, sixth, ninth, tenth, and eleventh ribs.  There is also an associated diffuse minimally comminuted left scapular fracture. Scapula fracture involves the glenoid surface.  No shoulder malalignment.  Small left pneumothorax evident with scattered peripheral left upper lobe and left lower lobe atelectasis/contusion.  Right base atelectasis also evident.  No right pneumothorax.  Trachea and airways are patent.  Previous coronary bypass changes noted.  No mediastinal hemorrhage or hematoma.  Thoracic aorta appears intact.  Normal heart size. No pericardial effusion.  Left chest subcutaneous emphysema noted.  IMPRESSION: Numerous acute left rib fractures with a small left pneumothorax and peripheral left lung atelectasis/contusion.  Diffuse left scapula fracture.  Right base atelectasis as well.  CT ABDOMEN AND PELVIS  Findings:  Calcified gallstones noted within the gallbladder dependently.  Liver, biliary system, pancreas, spleen, adrenal glands, and kidneys are within normal limits for age and demonstrate no acute finding.  Negative for bowel obstruction, dilatation, ileus pattern, free air, or central mesenteric abnormality.  No abdominal free fluid, fluid collection, hemorrhage, adenopathy, or abscess.  Scattered colonic diverticulosis.  Atherosclerosis of the aorta without aneurysm or dissection.  Pelvis:  Sigmoid diverticulosis evident.  No pelvic free fluid, hemorrhage, hematoma, adenopathy, inguinal abnormality, hernia. Urinary bladder unremarkable.  Osseous structures:  Acute superior endplate mild compression fractures noted at T11, T12, L1, and L2.  Left transverse process fractures at L1 and L2.  Posterior spinous process fractures at T11, T12 and  L1.  Posterior lamina and facet fractures at T12 and L1.  These are best appreciated on the sagittal reconstructions. Facets appear aligned.  No malalignment, subluxation or dislocation.  No significant retropulsion or canal narrowing. Pelvis  appears intact.  Hips are located.  IMPRESSION: T11, T12, L1, and L2 spinal fractures as described.  Cholelithiasis  No acute intra-abdominal or pelvic injury.  Diverticulosis  Critical Value/emergent results were called by telephone at the time of interpretation on 07/12/2012 at 3:30 p.m. to Dr.Pickering, who verbally acknowledged these results.   Original Report Authenticated By: Judie Petit. Ruel Favors, M.D.    Dg Chest Port 1 View  07/12/2012  **ADDENDUM** CREATED: 07/12/2012 15:38:13  There is mild displaced fracture of the left acromion.  Question nondisplaced fracture of the left scapula.  **END ADDENDUM** SIGNED BY: Natasha Mead, M.D.   07/12/2012  *RADIOLOGY REPORT*  Clinical Data: Fall  PORTABLE CHEST - 1 VIEW  Comparison: 01/21/2011  Findings: Study is limited by poor inspiration.  Status post CABG again noted.  Displaced fracture or of the left fourth fifth and sixth ribs.  No diagnostic pneumothorax.  Mild left basilar atelectasis or lung contusion. Probable calcified gallstones in the right upper quadrant of the abdomen.  IMPRESSION: Displaced fracture of the left fourth fifth and sixth ribs.  Mild left basilar atelectasis or lung contusion.  Status post CABG.   Original Report Authenticated By: Natasha Mead, M.D.    Dg Shoulder Left  07/12/2012  *RADIOLOGY REPORT*  Clinical Data: Post fall  LEFT SHOULDER - 2+ VIEW  Comparison: Chest x-rays same day  Findings: Mild displaced fracture of the left fourth fifth and fifth ribs.  Minimal displaced fracture of the left scapula.  Mild displaced fracture of the left acromion.  IMPRESSION: Mild displaced fracture of the left fourth fifth and fifth ribs. Minimal displaced fracture of the left scapula.  Mild displaced fracture of the left acromion.   Original Report Authenticated By: Natasha Mead, M.D.     Physical examination: Patient is a well-developed, well-nourished white male in no acute distress.  Blood pressure 142/80, pulse 86, temperature 97 F (36.1  C), temperature source Oral, resp. rate 18, SpO2 94.00%.  External examination: No battle sign, raccoon sign, or hemotympanum. Neurologic examination: Mental status examination: Patient is awake and alert, oriented to name, [facility], [place], and 07/12/2012. His speech is fluent, he has good comprehension. Cranial nerve examination: Pupils are equal round and reactive to light, and about 3 mm bilaterally. Extraocular movements are intact. Facial sensation is intact. Facial movement is symmetrical. Hearing is present bilaterally. Palpable movement is symmetrical. Shoulder shrug is symmetrical, although somewhat limited in the left to the scapular pain. Tongue is midline. Motor examination: Patient has 5 over 5 strength in the upper and lower extremities, although full testing of the left deltoid is limited due to left scapular pain. Otherwise we find full strength in the right deltoid, the biceps, triceps, intrinsics, grip, iliopsoas, quadriceps, dorsi flexor, and plantar flexor bilaterally. Sensory examination: Intact to the extremities bilaterally. Reflex examination: Minimal in the upper and lower extremities. Toes are downgoing bilaterally. Gait and stance: Not tested due to the nature of his condition.   Assessment/Plan: 66 year old man who fell from a ladder suffering a multiple trauma. Neurosurgical aspects include a head injury with small subdural hematoma and traumatic subarachnoid hemorrhage and multiple thoracolumbar fractures, as described above, at the T11, T12, L1, and L2 levels. He is also suffered a left scapular fracture. Patient is  being minutes the trauma surgical service.   As regards his head injury: I feel that he will need continued observation in the neurosurgical intensive care unit, with neurochecks, and a followup CT scan of the brain without contrast tomorrow, and when necessary. VTE prophylaxis will need to be done mechanically, and no pharmaceutical VTE  prophylaxis can be done.  As regards his spinal injury: I have recommended bedrest with his head of bed flat, with logrolling every 2 hours for the next 4 days. Subsequently he'll need to be mobilized in a TLSO, which will need to be donned and doffed in bed. PT and OT will be needed once we begin to mobilize the patient.  My assessment recommendations we'll discuss with the patient, his wife, and with Dr. Jimmye Norman from the trauma surgical service. I also had an opportunity to speak with his daughter Dr. Rozanna Box, by phone, an orthopedist in [place], [place], who is on her way to [place]. I explained in nature of his neurosurgical injuries and our recommendations for treatment and care. The patient's, his wife's, his daughters, and Dr. Dixon Boos questions were answered for them.  Hewitt Shorts, MD 07/12/2012, 6:20 PM

## 2012-07-12 NOTE — ED Notes (Signed)
Pt encouraged to take deep breaths, placed 2lnc on patient and o2 sats increased

## 2012-07-12 NOTE — Consult Note (Signed)
Orthopaedic Trauma Service  Reason: Fall with left scapula fracture Requesting: Trauma service   History of present illness   Patient is a very pleasant 66 year old right-hand-dominant Caucasian male who felt 12 feet off of a ladder earlier this afternoon while trying to clean his gutters appear and believes that his ladder slid out from under him and he fell to the deck below. He does not recall exactly how he landed. Patient was brought to [facility] for evaluation. He was seen and evaluated by the trauma service. Workup demonstrated a comminuted left scapular fracture. The orthopedic trauma service was consult and regarding this injury. Currently the patient is in B [address] and is accompanied by his wife. He complains of back pain as well as left shoulder pain. Also chest wall pain from rib fractures. Patient denies any numbness or tingling in his upper or lower extremities. His pain is localized around his left scapula as well as his left chest wall and back. Denies any chest pain, no shortness of breath, no nausea or vomiting. No palpitations. No blurred vision no headaches. Denies any recent illnesses. Pain is exacerbated with movement. He is having a difficult time finding a comfortable position while lying in bed secondary to his back. Patient denies any additional injuries to his other extremities.  Past Medical History  Diagnosis Date  . Coronary artery disease   . Hypertension   . Low back pain   . Hyperlipidemia    Past Surgical History  Procedure Date  . Cardiac surgery   . Lumbar disc surgery    No Known Allergies History reviewed. No pertinent family history.  Home medications  Aspirin  Quinapril  Simvastatin  Multivitamin  Vitamin E.  History   Social History  . Marital Status: Married    Spouse Name: N/A    Number of Children: N/A  . Years of Education: N/A   Occupational History  . Not on file.   Social History Main Topics  . Smoking status: Never Smoker    . Smokeless tobacco: Not on file  . Alcohol Use: Not on file  . Drug Use:   . Sexually Active:    Other Topics Concern  . Not on file   Social History Narrative   Pt is a retired Acupuncturist     Review of Systems  Constitutional: Negative for fever and chills.  Eyes: Negative for blurred vision.  Respiratory: Negative for shortness of breath and wheezing.   Cardiovascular: Negative for chest pain and palpitations.       + chest wall pain  Gastrointestinal: Negative for nausea and vomiting.  Musculoskeletal:       L shoulder pain Back pain  Neurological: Negative for dizziness, tingling, sensory change and focal weakness.    Physical exam  BP 142/80  Pulse 86  Temp 97 F (36.1 C) (Oral)  Resp 18  SpO2 94%   Physical Exam  Constitutional: He is oriented to person, place, and time. Vital signs are normal. He appears well-developed and well-nourished. He is cooperative. No distress.  Cardiovascular: Normal rate, regular rhythm, S1 normal and S2 normal.   Pulmonary/Chest:       No respiratory distress. Breath sounds are clear anteriorly. Patient does have tenderness with palpation along his left chest wall.  Abdominal:       + Bowel sounds, nontender, nondistended  Musculoskeletal:       Left upper extremity   The patient is tender with palpation along his scapula.  Nontender with palpation along his S.N.P.J. joint, clavicle or AC joint. Nontender with palpation along his humerus, elbow, forearm, wrist, hand.   No significant swelling of left upper extremity is noted   Radial, ulnar, median, axillary nerve motor and sensory functions are grossly intact.    Extremity is warm palpable radial pulses noted   Patient demonstrates good range of motion of his hands, wrist, forearm, elbow.   Shoulder not ranged secondary to discomfort and pain.  Right upper And bilateral lower extremities    No acute findings are noted    Old surgical scar from vein harvest is noted to  the medial aspect of the left leg    The patient is nontender with palpation of his lower extremities bilaterally. He is able to fully range his ankles, knees, hips under his own power without pain or difficulty.    The right upper extremity strength without difficulty as well.    Motor and sensory functions are grossly intact    Palpable pulses are appreciated  Pelvis   No pain or instability with evaluation.  Neurological: He is alert and oriented to person, place, and time.  Skin: Skin is warm and intact. He is not diaphoretic.  Psychiatric: He has a normal mood and affect. His speech is normal and behavior is normal. He is attentive.    Imaging Plain film and CT scan of chest and left shoulder demonstrates a complex comminuted left scapular fracture with extension of the fracture line into the glenoid minimal displacement is noted.  Labs  Date: 10/30 0700 - 10/31 0659  Time: 1335 1337 1345 1409 1513 1514 1530     BLD GAS (ART, VEN, CAP, CORD, SCALP)   TCO2    24      CHEM PROFILE   Sodium    143      Potassium    3.9      Chloride    106      BUN    13      Creatinine, Ser    0.90      Glucose, Bld    121      Calcium, Ion    1.21      CBC   WBC 14.5         RBC 4.34         Hemoglobin 13.5   13.9      HCT 39.1   41.0      MCV 90.1         MCH 31.1         MCHC 34.5         RDW 12.9         Platelets 244         DIFFERENTIAL   Neutrophils Relative 78         Lymphocytes Relative 16         Monocytes Relative 5         Eosinophils Relative 1         Basophils Relative 0         Neutro Abs 11.3         Lymphs Abs 2.4         Monocytes Absolute 0.7         Eosinophils Absolute 0.1         Basophils Absolute 0.0         DIABETES   Glucose, Bld    121  BLOOD TYPE/TRANSFUSIONS   Blood Bank Specimen   SAMPLE AVAILABLE FOR TESTING       Sample Expiration   07/13/2012           Assessment and plan  66 year old right-hand-dominant male status post  fall  1. Fall 2. Comminuted left scapular fracture with glenoid involvement  Nonoperative treatment  Sling for comfort  will begin gentle range of motion of the shoulder in a couple days  Unrestricted range of motion of the hand wrist, forearm and elbow  Ice as needed 3. T11-L2 fractures, sdh  Per neurosurgery 4. multiple left rib fractures  Adequate pain control  Aggressive incentive spirometry 5. continue per trauma service 6. Disposition  Admit for observation and and pain control.  Probable bedrest and bracing for vertebral fractures but neurosurgery consult pending   Sling for comfort left upper extremity, range of motion as patient can tolerate.   Mearl Latin, PA-C [facility] [phone] (P) 07/12/2012 5:08 PM

## 2012-07-12 NOTE — H&P (Signed)
Multiple injuries after fall off ladder including left scapular fracture, multiple left rib fractures, T & L-spine fractures, and small extra-axial hemorrhages.  Will be put in [facility] NICU.  This patient has been seen and I agree with the findings and treatment plan.  Marta Lamas. Gae Bon, MD, FACS [phone] (pager) [phone] (direct pager) Trauma Surgeon

## 2012-07-13 ENCOUNTER — Encounter (HOSPITAL_COMMUNITY): Payer: Self-pay | Admitting: Radiology

## 2012-07-13 ENCOUNTER — Inpatient Hospital Stay (HOSPITAL_COMMUNITY): Payer: Medicare Other

## 2012-07-13 LAB — CBC
HCT: 33.8 % — ABNORMAL LOW (ref 39.0–52.0)
Hemoglobin: 11.4 g/dL — ABNORMAL LOW (ref 13.0–17.0)
MCH: 30.6 pg (ref 26.0–34.0)
MCHC: 33.7 g/dL (ref 30.0–36.0)
MCV: 90.6 fL (ref 78.0–100.0)
Platelets: 210 10*3/uL (ref 150–400)
RBC: 3.73 MIL/uL — ABNORMAL LOW (ref 4.22–5.81)
RDW: 13.3 % (ref 11.5–15.5)
WBC: 9.9 10*3/uL (ref 4.0–10.5)

## 2012-07-13 LAB — BASIC METABOLIC PANEL
BUN: 13 mg/dL (ref 6–23)
CO2: 27 mEq/L (ref 19–32)
Calcium: 8.8 mg/dL (ref 8.4–10.5)
Chloride: 103 mEq/L (ref 96–112)
Creatinine, Ser: 0.67 mg/dL (ref 0.50–1.35)
GFR calc Af Amer: >90 mL/min (ref >90)
GFR calc non Af Amer: >90 mL/min (ref >90)
Glucose, Bld: 168 mg/dL — ABNORMAL HIGH (ref 70–99)
Potassium: 4 mEq/L (ref 3.5–5.1)
Sodium: 139 mEq/L (ref 135–145)

## 2012-07-13 MED ORDER — OXYCODONE-ACETAMINOPHEN 5-325 MG PO TABS: 1.0000 | ORAL_TABLET | ORAL | Status: DC | PRN

## 2012-07-13 MED ORDER — TIZANIDINE HCL 4 MG PO TABS
4.0000 mg | ORAL_TABLET | Freq: Three times a day (TID) | ORAL | Status: DC | PRN
  Administered 2012-07-13 – 2012-07-14 (×2): 4 mg via ORAL
  Filled 2012-07-13 (×4): qty 1

## 2012-07-13 MED ORDER — PNEUMOCOCCAL VAC POLYVALENT 25 MCG/0.5ML IJ INJ: 0.5000 mL | INJECTION | INTRAMUSCULAR | Status: DC | PRN

## 2012-07-13 MED ORDER — INFLUENZA VIRUS VACC SPLIT PF IM SUSP: 0.5000 mL | INTRAMUSCULAR | Status: DC | PRN

## 2012-07-13 NOTE — Progress Notes (Signed)
Orthopaedic Trauma Service  Subjective: Doing ok this am On bedrest Dec pain L shoulder/scapula C/o L rib pain most   Objective: Vital signs in last 24 hours: Temp:  [97 F (36.1 C)-98.6 F (37 C)] 98 F (36.7 C) (10/31 0700) Pulse Rate:  [77-118] 90  (10/31 1000) Resp:  [11-28] 28  (10/31 1000) BP: (91-142)/(41-85) 91/46 mmHg (10/31 1000) SpO2:  [84 %-100 %] 97 % (10/31 1000) Weight:  [64.6 kg (142 lb 6.7 oz)] 64.6 kg (142 lb 6.7 oz) (10/30 1930)  Intake/Output from previous day: 10/30 0701 - 10/31 0700 In: 837.3 [I.V.:837.3] Out: 350 [Urine:350] Intake/Output this shift: Total I/O In: 220 [P.O.:220] Out: 300 [Urine:300] Intake/Output      10/30 0701 - 10/31 0700 10/31 0701 - 11/01 0700   P.O.  220   I.V. (mL/kg) 837.3 (13)    Total Intake(mL/kg) 837.3 (13) 220 (3.4)   Urine (mL/kg/hr) 350 (0.2) 300 (1.1)   Total Output 350 300   Net +487.3 -80        Emesis Occurrence 1 x        Basename 07/13/12 0504 07/12/12 1409 07/12/12 1335  HGB 11.4* 13.9 13.5    Basename 07/13/12 0504 07/12/12 1409 07/12/12 1335  WBC 9.9 -- 14.5*  RBC 3.73* -- 4.34  HCT 33.8* 41.0 --  PLT 210 -- 244    Basename 07/13/12 0504 07/12/12 1409  NA 139 143  K 4.0 3.9  CL 103 106  CO2 27 --  BUN 13 13  CREATININE 0.67 0.90  GLUCOSE 168* 121*  CALCIUM 8.8 --   No results found for this basename: LABPT:2,INR:2 in the last 72 hours  Phyical Exam  ZOX:WRUEA flat, NAD Ext:  Left upper extremity   No significant swelling   Motor and sensory functions intact   + radial pulse   Ext warm   Good motion of L elbow, forearm, wrist and hand  Assessment/Plan:  66 year old right-hand-dominant male status post fall   1. Fall  2. Comminuted left scapular fracture with glenoid involvement   Nonoperative treatment   Sling for comfort   will begin gentle range of motion of the shoulder in a couple days   Unrestricted range of motion of the hand wrist, forearm and elbow   Ice as  needed  3. T11-L2 fractures, sdh   Per neurosurgery  4. multiple left rib fractures   Adequate pain control   Aggressive incentive spirometry  5. continue per trauma service  6. Disposition   Continue per TS and NS  Will see again on monday   Mearl Latin, PA-C [facility] [phone] (P) 07/13/2012, 11:11 AM

## 2012-07-13 NOTE — Progress Notes (Signed)
Orthopedic Tech Progress Note Patient Details:  Rodney Riley 09/14/1945 [id number]  Patient ID: [name], male   DOB: October 04, 1945, 66 y.o.   MRN: [id number] Shoulder immobilizer was left in pt's room as pt will wear it for comfort when getting up according to doctor's orders;rn notified  Nikki Dom 07/13/2012, 2:55 PM

## 2012-07-13 NOTE — Progress Notes (Signed)
BP in low to mid 90's this am, did not give lisinopril.

## 2012-07-13 NOTE — Progress Notes (Signed)
Subjective: Patient fairly comfortable. Laying flat in bed, but in reversed Trendelenburg. He explains that the staff has been only rarely turning him on his side. CT brain without this morning shows no significant change in small right hemispheric subdural hematoma and traumatic subarachnoid hemorrhage.  Objective: Vital signs in last 24 hours: Filed Vitals:   07/13/12 0400 07/13/12 0500 07/13/12 0600 07/13/12 0700  BP: 114/61 101/56 107/54 111/53  Pulse: 106 100 99 96  Temp: 97.8 F (36.6 C)   98 F (36.7 C)  TempSrc: Oral   Oral  Resp: 18 13 20 16   Height:      Weight:      SpO2: 96% 96% 96% 94%    Intake/Output from previous day: 10/30 0701 - 10/31 0700 In: 837.3 [I.V.:837.3] Out: 350 [Urine:350] Intake/Output this shift:    Physical Exam:  Awake and alert, oriented fully. Speech is fluent, following commands. Pupils equal round and reactive to light. Extra ocular movements intact. Facial movements symmetrical. Moving all 4 extremities well, with full-strength.  CBC  Basename 07/13/12 0504 07/12/12 1409 07/12/12 1335  WBC 9.9 -- 14.5*  HGB 11.4* 13.9 --  HCT 33.8* 41.0 --  PLT 210 -- 244   BMET  Basename 07/13/12 0504 07/12/12 1409  NA 139 143  K 4.0 3.9  CL 103 106  CO2 27 --  GLUCOSE 168* 121*  BUN 13 13  CREATININE 0.67 0.90  CALCIUM 8.8 --    Assessment/Plan: Regarding head injury, will need repeat CT brain without on Monday, November 4.  Regarding thoracolumbar spine injury, will need to continue on bedrest, with bed and the head of bed flat, and logrolling side to side every 2 hours. I've discussed this with his nurse for today's shift, and she understands our goals. TLSO to be obtained from [employer]. We'll plan on mobilizing on Monday, November 4 with PT and OT. The TLSO will need to be donned and doffed in bed.   Hewitt Shorts, MD 07/13/2012, 7:48 AM

## 2012-07-13 NOTE — Progress Notes (Signed)
Orthopedic Tech Progress Note Patient Details:  Rodney Riley 12-13-1945 [id number]  Ortho Devices Type of Ortho Device: Sling immobilizer Ortho Device/Splint Location: left arm Ortho Device/Splint Interventions: Freeman Caldron, Timithy Arons 07/13/2012, 2:55 PM

## 2012-07-13 NOTE — Progress Notes (Signed)
Clarified use of L arm/shoulder sling. Pt may wear sling while in bed for comfort. If pt comfortable without sling while in bed, pt is not required to wear it.  Spoke with Montez Morita, PA.

## 2012-07-13 NOTE — Progress Notes (Signed)
Patient ID: Rodney Riley, male   DOB: 04/21/1946, 66 y.o.   MRN: [id number]    Subjective: Mild nausea, no more emesis  Objective: Vital signs in last 24 hours: Temp:  [97 F (36.1 C)-98.6 F (37 C)] 98 F (36.7 C) (10/31 0700) Pulse Rate:  [77-118] 96  (10/31 0700) Resp:  [11-23] 16  (10/31 0810) BP: (101-142)/(41-85) 111/53 mmHg (10/31 0700) SpO2:  [84 %-100 %] 94 % (10/31 0810) Weight:  [64.6 kg (142 lb 6.7 oz)] 64.6 kg (142 lb 6.7 oz) (10/30 1930)    Intake/Output from previous day: 10/30 0701 - 10/31 0700 In: 837.3 [I.V.:837.3] Out: 350 [Urine:350] Intake/Output this shift:    General appearance: alert and cooperative Resp: clear to auscultation bilaterally Chest wall: left sided chest wall tenderness Cardio: regular rate and rhythm GI: soft, NT, +BS Neuro: MAE well, only chronic tingling LLE  Lab Results: CBC   Basename 07/13/12 0504 07/12/12 1409 07/12/12 1335  WBC 9.9 -- 14.5*  HGB 11.4* 13.9 --  HCT 33.8* 41.0 --  PLT 210 -- 244   BMET  Basename 07/13/12 0504 07/12/12 1409  NA 139 143  K 4.0 3.9  CL 103 106  CO2 27 --  GLUCOSE 168* 121*  BUN 13 13  CREATININE 0.67 0.90  CALCIUM 8.8 --   PT/INR No results found for this basename: LABPROT:2,INR:2 in the last 72 hours ABG No results found for this basename: PHART:2,PCO2:2,PO2:2,HCO3:2 in the last 72 hours  Studies/Results: Ct Head Without Contrast  07/13/2012  *RADIOLOGY REPORT*  Clinical Data: Fall from ladder.  Subarachnoid hemorrhage.  CT HEAD WITHOUT CONTRAST  Technique:  Contiguous axial images were obtained from the base of the skull through the vertex without contrast.  Comparison: Head CT scan 07/12/2012.  Findings: Scattered small amount of subarachnoid hemorrhage is again seen on the right.  Now visible is a small amount of scattered subarachnoid hemorrhage on the left.  Very small subdural collection over the right temporal lobe is unchanged.  There is no midline shift, evidence of acute  infarction, mass lesion or hydrocephalus.  Left occipital bone fracture is again seen.  IMPRESSION: Small amount of subarachnoid hemorrhage is now visible on the left. Subarachnoid hemorrhage on the right and small right subdural hematoma appear unchanged.  No hydrocephalus or midline shift.   Original Report Authenticated By: Bernadene Bell. Maricela Curet, M.D.    Ct Head Wo Contrast  07/12/2012  *RADIOLOGY REPORT*  Clinical Data:  Larey Seat from ladder  CT HEAD WITHOUT CONTRAST CT CERVICAL SPINE WITHOUT CONTRAST  Technique:  Multidetector CT imaging of the head and cervical spine was performed following the standard protocol without intravenous contrast.  Multiplanar CT image reconstructions of the cervical spine were also generated.  Comparison:   None  CT HEAD  Findings: Small amount of subarachnoid hemorrhage in the right temporal and   parietal region.  Possible small subdural hematoma right temporal lobe.  No shift of the midline structures.  No acute infarct or mass.  Ventricle size is normal.  There is widening of the lambdoid suture on the left compatible with diastasis.  This is most likely acute.  Nondisplaced fracture of the left occipital bone extending into the foramen magnum. Chronic sinusitis is present.  IMPRESSION: Mild subarachnoid hemorrhage on the right.  Possible small subdural hematoma right temporal lobe.  No midline shift.  Fracture of the left occipital bone and diastasis of the left lambdoid suture.  Chronic sinusitis.  CT CERVICAL SPINE  Findings: Tiny  left apical pneumothorax.  Normal cervical alignment.  Negative for fracture of the cervical spine.  There is prominent facet degeneration in the cervical spine, most prominent on the left at C3-4 C4-5 and C5-6.  Nondisplaced fracture left occipital bone extending into the foramen magnum.  Diastasis of the lambdoid suture on the left.  IMPRESSION: Tiny left apical pneumothorax.  Negative for cervical spine fracture  Left occipital bone fracture.    Original Report Authenticated By: Camelia Phenes, M.D.    Ct Chest W Contrast  07/12/2012  *RADIOLOGY REPORT*  Clinical Data:  Fall from ladder, left shoulder, chest and back pain  CT CHEST, ABDOMEN AND PELVIS WITH CONTRAST  Technique:  Multidetector CT imaging of the chest, abdomen and pelvis was performed following the standard protocol during bolus administration of intravenous contrast.  Contrast: OMNIPAQUE IOHEXOL 300 MG/ML  SOLN  Comparison:  07/12/2012  CT CHEST  Findings:  Left rib fractures noted involving the left fourth, fifth, sixth, ninth, tenth, and eleventh ribs.  There is also an associated diffuse minimally comminuted left scapular fracture. Scapula fracture involves the glenoid surface.  No shoulder malalignment.  Small left pneumothorax evident with scattered peripheral left upper lobe and left lower lobe atelectasis/contusion.  Right base atelectasis also evident.  No right pneumothorax.  Trachea and airways are patent.  Previous coronary bypass changes noted.  No mediastinal hemorrhage or hematoma.  Thoracic aorta appears intact.  Normal heart size. No pericardial effusion.  Left chest subcutaneous emphysema noted.  IMPRESSION: Numerous acute left rib fractures with a small left pneumothorax and peripheral left lung atelectasis/contusion.  Diffuse left scapula fracture.  Right base atelectasis as well.  CT ABDOMEN AND PELVIS  Findings:  Calcified gallstones noted within the gallbladder dependently.  Liver, biliary system, pancreas, spleen, adrenal glands, and kidneys are within normal limits for age and demonstrate no acute finding.  Negative for bowel obstruction, dilatation, ileus pattern, free air, or central mesenteric abnormality.  No abdominal free fluid, fluid collection, hemorrhage, adenopathy, or abscess.  Scattered colonic diverticulosis.  Atherosclerosis of the aorta without aneurysm or dissection.  Pelvis:  Sigmoid diverticulosis evident.  No pelvic free fluid, hemorrhage,  hematoma, adenopathy, inguinal abnormality, hernia. Urinary bladder unremarkable.  Osseous structures:  Acute superior endplate mild compression fractures noted at T11, T12, L1, and L2.  Left transverse process fractures at L1 and L2.  Posterior spinous process fractures at T11, T12 and L1.  Posterior lamina and facet fractures at T12 and L1.  These are best appreciated on the sagittal reconstructions. Facets appear aligned.  No malalignment, subluxation or dislocation.  No significant retropulsion or canal narrowing. Pelvis appears intact.  Hips are located.  IMPRESSION: T11, T12, L1, and L2 spinal fractures as described.  Cholelithiasis  No acute intra-abdominal or pelvic injury.  Diverticulosis  Critical Value/emergent results were called by telephone at the time of interpretation on 07/12/2012 at 3:30 p.m. to Dr.Pickering, who verbally acknowledged these results.   Original Report Authenticated By: Judie Petit. Ruel Favors, M.D.    Ct Cervical Spine Wo Contrast  07/12/2012  *RADIOLOGY REPORT*  Clinical Data:  Larey Seat from ladder  CT HEAD WITHOUT CONTRAST CT CERVICAL SPINE WITHOUT CONTRAST  Technique:  Multidetector CT imaging of the head and cervical spine was performed following the standard protocol without intravenous contrast.  Multiplanar CT image reconstructions of the cervical spine were also generated.  Comparison:   None  CT HEAD  Findings: Small amount of subarachnoid hemorrhage in the right temporal and  parietal region.  Possible small subdural hematoma right temporal lobe.  No shift of the midline structures.  No acute infarct or mass.  Ventricle size is normal.  There is widening of the lambdoid suture on the left compatible with diastasis.  This is most likely acute.  Nondisplaced fracture of the left occipital bone extending into the foramen magnum. Chronic sinusitis is present.  IMPRESSION: Mild subarachnoid hemorrhage on the right.  Possible small subdural hematoma right temporal lobe.  No midline  shift.  Fracture of the left occipital bone and diastasis of the left lambdoid suture.  Chronic sinusitis.  CT CERVICAL SPINE  Findings: Tiny left apical pneumothorax.  Normal cervical alignment.  Negative for fracture of the cervical spine.  There is prominent facet degeneration in the cervical spine, most prominent on the left at C3-4 C4-5 and C5-6.  Nondisplaced fracture left occipital bone extending into the foramen magnum.  Diastasis of the lambdoid suture on the left.  IMPRESSION: Tiny left apical pneumothorax.  Negative for cervical spine fracture  Left occipital bone fracture.   Original Report Authenticated By: Camelia Phenes, M.D.    Ct Abdomen Pelvis W Contrast  07/12/2012  *RADIOLOGY REPORT*  Clinical Data:  Fall from ladder, left shoulder, chest and back pain  CT CHEST, ABDOMEN AND PELVIS WITH CONTRAST  Technique:  Multidetector CT imaging of the chest, abdomen and pelvis was performed following the standard protocol during bolus administration of intravenous contrast.  Contrast: OMNIPAQUE IOHEXOL 300 MG/ML  SOLN  Comparison:  07/12/2012  CT CHEST  Findings:  Left rib fractures noted involving the left fourth, fifth, sixth, ninth, tenth, and eleventh ribs.  There is also an associated diffuse minimally comminuted left scapular fracture. Scapula fracture involves the glenoid surface.  No shoulder malalignment.  Small left pneumothorax evident with scattered peripheral left upper lobe and left lower lobe atelectasis/contusion.  Right base atelectasis also evident.  No right pneumothorax.  Trachea and airways are patent.  Previous coronary bypass changes noted.  No mediastinal hemorrhage or hematoma.  Thoracic aorta appears intact.  Normal heart size. No pericardial effusion.  Left chest subcutaneous emphysema noted.  IMPRESSION: Numerous acute left rib fractures with a small left pneumothorax and peripheral left lung atelectasis/contusion.  Diffuse left scapula fracture.  Right base atelectasis  as well.  CT ABDOMEN AND PELVIS  Findings:  Calcified gallstones noted within the gallbladder dependently.  Liver, biliary system, pancreas, spleen, adrenal glands, and kidneys are within normal limits for age and demonstrate no acute finding.  Negative for bowel obstruction, dilatation, ileus pattern, free air, or central mesenteric abnormality.  No abdominal free fluid, fluid collection, hemorrhage, adenopathy, or abscess.  Scattered colonic diverticulosis.  Atherosclerosis of the aorta without aneurysm or dissection.  Pelvis:  Sigmoid diverticulosis evident.  No pelvic free fluid, hemorrhage, hematoma, adenopathy, inguinal abnormality, hernia. Urinary bladder unremarkable.  Osseous structures:  Acute superior endplate mild compression fractures noted at T11, T12, L1, and L2.  Left transverse process fractures at L1 and L2.  Posterior spinous process fractures at T11, T12 and L1.  Posterior lamina and facet fractures at T12 and L1.  These are best appreciated on the sagittal reconstructions. Facets appear aligned.  No malalignment, subluxation or dislocation.  No significant retropulsion or canal narrowing. Pelvis appears intact.  Hips are located.  IMPRESSION: T11, T12, L1, and L2 spinal fractures as described.  Cholelithiasis  No acute intra-abdominal or pelvic injury.  Diverticulosis  Critical Value/emergent results were called by telephone at  the time of interpretation on 07/12/2012 at 3:30 p.m. to Dr.Pickering, who verbally acknowledged these results.   Original Report Authenticated By: Judie Petit. Ruel Favors, M.D.    Dg Chest Port 1 View  07/13/2012  *RADIOLOGY REPORT*  Clinical Data: History of rib fractures, follow-up  PORTABLE CHEST - 1 VIEW  Comparison: Portable chest x-ray of 07/12/2012 and CT chest of the same date  Findings: Acute fractures of the lateral left fourth, fifth, sixth ribs again are noted.  No definite pneumothorax is seen by portable chest x-ray.  There does appear to be a small left  pleural effusion with left basilar atelectasis consistent with atelectasis and possibly contusion.  Mild volume loss at the right lung base is noted.  The heart is mildly enlarged.  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  IMPRESSION:  1.  Little change to slight decrease in aeration with basilar atelectasis, left greater than right, and possible left basilar contusion with small effusion. 2.  No definite pneumothorax.  No change in acute left posterolateral left rib fractures.   Original Report Authenticated By: Dwyane Dee, M.D.    Dg Chest Port 1 View  07/12/2012  **ADDENDUM** CREATED: 07/12/2012 15:38:13  There is mild displaced fracture of the left acromion.  Question nondisplaced fracture of the left scapula.  **END ADDENDUM** SIGNED BY: Natasha Mead, M.D.   07/12/2012  *RADIOLOGY REPORT*  Clinical Data: Fall  PORTABLE CHEST - 1 VIEW  Comparison: 01/21/2011  Findings: Study is limited by poor inspiration.  Status post CABG again noted.  Displaced fracture or of the left fourth fifth and sixth ribs.  No diagnostic pneumothorax.  Mild left basilar atelectasis or lung contusion. Probable calcified gallstones in the right upper quadrant of the abdomen.  IMPRESSION: Displaced fracture of the left fourth fifth and sixth ribs.  Mild left basilar atelectasis or lung contusion.  Status post CABG.   Original Report Authenticated By: Natasha Mead, M.D.    Dg Shoulder Left  07/12/2012  *RADIOLOGY REPORT*  Clinical Data: Post fall  LEFT SHOULDER - 2+ VIEW  Comparison: Chest x-rays same day  Findings: Mild displaced fracture of the left fourth fifth and fifth ribs.  Minimal displaced fracture of the left scapula.  Mild displaced fracture of the left acromion.  IMPRESSION: Mild displaced fracture of the left fourth fifth and fifth ribs. Minimal displaced fracture of the left scapula.  Mild displaced fracture of the left acromion.   Original Report Authenticated By: Natasha Mead, M.D.      Anti-infectives: Anti-infectives    None      Assessment/Plan: Fall Multiple L rib Fxs - pulmonary toilet, IS, add oral pain meds L scapula Fx - non-op per Dr. Carola Frost T11, T12, L1, L2 Fx - bedrest until Monday then up with brace per Dr. Mar Daring [facility] and [facility] - neuro exam stable FEN - likely some ileus, continue clears VTE - PAS ([facility])   LOS: 1 day    Violeta Gelinas, MD, MPH, FACS Pager: [phone]  07/13/2012

## 2012-07-13 NOTE — Progress Notes (Signed)
UR complete 

## 2012-07-14 LAB — CBC
HCT: 30.8 % — ABNORMAL LOW (ref 39.0–52.0)
Hemoglobin: 10.4 g/dL — ABNORMAL LOW (ref 13.0–17.0)
MCH: 30.6 pg (ref 26.0–34.0)
MCHC: 33.8 g/dL (ref 30.0–36.0)
MCV: 90.6 fL (ref 78.0–100.0)
Platelets: 165 10*3/uL (ref 150–400)
RBC: 3.4 MIL/uL — ABNORMAL LOW (ref 4.22–5.81)
RDW: 13.2 % (ref 11.5–15.5)
WBC: 8.3 10*3/uL (ref 4.0–10.5)

## 2012-07-14 LAB — BASIC METABOLIC PANEL
BUN: 12 mg/dL (ref 6–23)
CO2: 26 mEq/L (ref 19–32)
Calcium: 8.5 mg/dL (ref 8.4–10.5)
Chloride: 100 mEq/L (ref 96–112)
Creatinine, Ser: 0.61 mg/dL (ref 0.50–1.35)
GFR calc Af Amer: >90 mL/min (ref >90)
GFR calc non Af Amer: >90 mL/min (ref >90)
Glucose, Bld: 141 mg/dL — ABNORMAL HIGH (ref 70–99)
Potassium: 4.3 mEq/L (ref 3.5–5.1)
Sodium: 133 mEq/L — ABNORMAL LOW (ref 135–145)

## 2012-07-14 MED ORDER — IPRATROPIUM-ALBUTEROL 18-103 MCG/ACT IN AERO
2.0000 | INHALATION_SPRAY | Freq: Four times a day (QID) | RESPIRATORY_TRACT | Status: DC
  Administered 2012-07-14 – 2012-07-18 (×16): 2 via RESPIRATORY_TRACT
  Filled 2012-07-14: qty 14.7

## 2012-07-14 MED ORDER — POLYVINYL ALCOHOL 1.4 % OP SOLN
2.0000 [drp] | OPHTHALMIC | Status: DC | PRN
  Filled 2012-07-14: qty 15

## 2012-07-14 MED ORDER — TRAMADOL HCL 50 MG PO TABS
50.0000 mg | ORAL_TABLET | Freq: Four times a day (QID) | ORAL | Status: DC
  Administered 2012-07-14 – 2012-07-18 (×16): 50 mg via ORAL
  Filled 2012-07-14 (×25): qty 1

## 2012-07-14 NOTE — Progress Notes (Signed)
Subjective: Patient complaining of left chest discomfort, probably from a combination of his 6 left sided rib fractures as well as his left scapular fracture.  Objective: Vital signs in last 24 hours: Filed Vitals:   07/14/12 0500 07/14/12 0600 07/14/12 0700 07/14/12 0822  BP: 108/55 115/62 136/60   Pulse: 84 86 87   Temp:   99.2 F (37.3 C)   TempSrc:   Axillary   Resp: 20 19 24 24   Height:      Weight:      SpO2: 96% 98% 97% 96%    Intake/Output from previous day: 10/31 0701 - 11/01 0700 In: 2160 [P.O.:345; I.V.:1815] Out: 500 [Urine:500] Intake/Output this shift:    Physical Exam:  Awake and alert, fully oriented. Speech fluent. Following commands. Moving all 4 extremities well.  CBC  Basename 07/14/12 0425 07/13/12 0504  WBC 8.3 9.9  HGB 10.4* 11.4*  HCT 30.8* 33.8*  PLT 165 210   BMET  Basename 07/14/12 0425 07/13/12 0504  NA 133* 139  K 4.3 4.0  CL 100 103  CO2 26 27  GLUCOSE 141* 168*  BUN 12 13  CREATININE 0.61 0.67  CALCIUM 8.5 8.8    Assessment/Plan: Patient continues on bedrest, with head of bed flat, logrolling side to side. Spoke with patient and his daughter about the importance of him doing incentives spirometry vigorously every hour. Discussed plans for mobilizing, if pain is improved, on Monday, November 4. For CT brain without contrast on Monday, November 4. Patient's and his daughter's questions were answered for him. Discussed case and plans for treatment and care with Dr. Violeta Gelinas from trauma surgery.   Hewitt Shorts, MD 07/14/2012, 8:32 AM

## 2012-07-14 NOTE — Progress Notes (Signed)
Patient ID: Rodney Riley, male   DOB: May 17, 1946, 66 y.o.   MRN: [id number]    Subjective: Pain L ribs and back with deep breath  Objective: Vital signs in last 24 hours: Temp:  [98.2 F (36.8 C)-99.2 F (37.3 C)] 99.2 F (37.3 C) (11/01 0700) Pulse Rate:  [83-97] 87  (11/01 0700) Resp:  [14-28] 24  (11/01 0700) BP: (82-136)/(40-74) 136/60 mmHg (11/01 0700) SpO2:  [75 %-98 %] 97 % (11/01 0700) Last BM Date: 07/12/12  Intake/Output from previous day: 10/31 0701 - 11/01 0700 In: 2160 [P.O.:345; I.V.:1815] Out: 500 [Urine:500] Intake/Output this shift:    General appearance: alert and cooperative Resp: clear but limited inspiration due to pain Chest wall: left sided chest wall tenderness Cardio: regular rate and rhythm GI: soft, minimal BS, NT Neuro: A&O, MAE  Lab Results: CBC   Basename 07/14/12 0425 07/13/12 0504  WBC 8.3 9.9  HGB 10.4* 11.4*  HCT 30.8* 33.8*  PLT 165 210   BMET  Basename 07/14/12 0425 07/13/12 0504  NA 133* 139  K 4.3 4.0  CL 100 103  CO2 26 27  GLUCOSE 141* 168*  BUN 12 13  CREATININE 0.61 0.67  CALCIUM 8.5 8.8   PT/INR No results found for this basename: LABPROT:2,INR:2 in the last 72 hours ABG No results found for this basename: PHART:2,PCO2:2,PO2:2,HCO3:2 in the last 72 hours  Studies/Results: Ct Head Without Contrast  07/13/2012  *RADIOLOGY REPORT*  Clinical Data: Fall from ladder.  Subarachnoid hemorrhage.  CT HEAD WITHOUT CONTRAST  Technique:  Contiguous axial images were obtained from the base of the skull through the vertex without contrast.  Comparison: Head CT scan 07/12/2012.  Findings: Scattered small amount of subarachnoid hemorrhage is again seen on the right.  Now visible is a small amount of scattered subarachnoid hemorrhage on the left.  Very small subdural collection over the right temporal lobe is unchanged.  There is no midline shift, evidence of acute infarction, mass lesion or hydrocephalus.  Left occipital bone  fracture is again seen.  IMPRESSION: Small amount of subarachnoid hemorrhage is now visible on the left. Subarachnoid hemorrhage on the right and small right subdural hematoma appear unchanged.  No hydrocephalus or midline shift.   Original Report Authenticated By: Bernadene Bell. Maricela Curet, M.D.    Ct Head Wo Contrast  07/12/2012  *RADIOLOGY REPORT*  Clinical Data:  Larey Seat from ladder  CT HEAD WITHOUT CONTRAST CT CERVICAL SPINE WITHOUT CONTRAST  Technique:  Multidetector CT imaging of the head and cervical spine was performed following the standard protocol without intravenous contrast.  Multiplanar CT image reconstructions of the cervical spine were also generated.  Comparison:   None  CT HEAD  Findings: Small amount of subarachnoid hemorrhage in the right temporal and   parietal region.  Possible small subdural hematoma right temporal lobe.  No shift of the midline structures.  No acute infarct or mass.  Ventricle size is normal.  There is widening of the lambdoid suture on the left compatible with diastasis.  This is most likely acute.  Nondisplaced fracture of the left occipital bone extending into the foramen magnum. Chronic sinusitis is present.  IMPRESSION: Mild subarachnoid hemorrhage on the right.  Possible small subdural hematoma right temporal lobe.  No midline shift.  Fracture of the left occipital bone and diastasis of the left lambdoid suture.  Chronic sinusitis.  CT CERVICAL SPINE  Findings: Tiny left apical pneumothorax.  Normal cervical alignment.  Negative for fracture of the cervical spine.  There is prominent facet degeneration in the cervical spine, most prominent on the left at C3-4 C4-5 and C5-6.  Nondisplaced fracture left occipital bone extending into the foramen magnum.  Diastasis of the lambdoid suture on the left.  IMPRESSION: Tiny left apical pneumothorax.  Negative for cervical spine fracture  Left occipital bone fracture.   Original Report Authenticated By: Camelia Phenes, M.D.    Ct  Chest W Contrast  07/12/2012  *RADIOLOGY REPORT*  Clinical Data:  Fall from ladder, left shoulder, chest and back pain  CT CHEST, ABDOMEN AND PELVIS WITH CONTRAST  Technique:  Multidetector CT imaging of the chest, abdomen and pelvis was performed following the standard protocol during bolus administration of intravenous contrast.  Contrast: OMNIPAQUE IOHEXOL 300 MG/ML  SOLN  Comparison:  07/12/2012  CT CHEST  Findings:  Left rib fractures noted involving the left fourth, fifth, sixth, ninth, tenth, and eleventh ribs.  There is also an associated diffuse minimally comminuted left scapular fracture. Scapula fracture involves the glenoid surface.  No shoulder malalignment.  Small left pneumothorax evident with scattered peripheral left upper lobe and left lower lobe atelectasis/contusion.  Right base atelectasis also evident.  No right pneumothorax.  Trachea and airways are patent.  Previous coronary bypass changes noted.  No mediastinal hemorrhage or hematoma.  Thoracic aorta appears intact.  Normal heart size. No pericardial effusion.  Left chest subcutaneous emphysema noted.  IMPRESSION: Numerous acute left rib fractures with a small left pneumothorax and peripheral left lung atelectasis/contusion.  Diffuse left scapula fracture.  Right base atelectasis as well.  CT ABDOMEN AND PELVIS  Findings:  Calcified gallstones noted within the gallbladder dependently.  Liver, biliary system, pancreas, spleen, adrenal glands, and kidneys are within normal limits for age and demonstrate no acute finding.  Negative for bowel obstruction, dilatation, ileus pattern, free air, or central mesenteric abnormality.  No abdominal free fluid, fluid collection, hemorrhage, adenopathy, or abscess.  Scattered colonic diverticulosis.  Atherosclerosis of the aorta without aneurysm or dissection.  Pelvis:  Sigmoid diverticulosis evident.  No pelvic free fluid, hemorrhage, hematoma, adenopathy, inguinal abnormality, hernia. Urinary  bladder unremarkable.  Osseous structures:  Acute superior endplate mild compression fractures noted at T11, T12, L1, and L2.  Left transverse process fractures at L1 and L2.  Posterior spinous process fractures at T11, T12 and L1.  Posterior lamina and facet fractures at T12 and L1.  These are best appreciated on the sagittal reconstructions. Facets appear aligned.  No malalignment, subluxation or dislocation.  No significant retropulsion or canal narrowing. Pelvis appears intact.  Hips are located.  IMPRESSION: T11, T12, L1, and L2 spinal fractures as described.  Cholelithiasis  No acute intra-abdominal or pelvic injury.  Diverticulosis  Critical Value/emergent results were called by telephone at the time of interpretation on 07/12/2012 at 3:30 p.m. to Dr.Pickering, who verbally acknowledged these results.   Original Report Authenticated By: Judie Petit. Ruel Favors, M.D.    Ct Cervical Spine Wo Contrast  07/12/2012  *RADIOLOGY REPORT*  Clinical Data:  Larey Seat from ladder  CT HEAD WITHOUT CONTRAST CT CERVICAL SPINE WITHOUT CONTRAST  Technique:  Multidetector CT imaging of the head and cervical spine was performed following the standard protocol without intravenous contrast.  Multiplanar CT image reconstructions of the cervical spine were also generated.  Comparison:   None  CT HEAD  Findings: Small amount of subarachnoid hemorrhage in the right temporal and   parietal region.  Possible small subdural hematoma right temporal lobe.  No shift of the  midline structures.  No acute infarct or mass.  Ventricle size is normal.  There is widening of the lambdoid suture on the left compatible with diastasis.  This is most likely acute.  Nondisplaced fracture of the left occipital bone extending into the foramen magnum. Chronic sinusitis is present.  IMPRESSION: Mild subarachnoid hemorrhage on the right.  Possible small subdural hematoma right temporal lobe.  No midline shift.  Fracture of the left occipital bone and diastasis of  the left lambdoid suture.  Chronic sinusitis.  CT CERVICAL SPINE  Findings: Tiny left apical pneumothorax.  Normal cervical alignment.  Negative for fracture of the cervical spine.  There is prominent facet degeneration in the cervical spine, most prominent on the left at C3-4 C4-5 and C5-6.  Nondisplaced fracture left occipital bone extending into the foramen magnum.  Diastasis of the lambdoid suture on the left.  IMPRESSION: Tiny left apical pneumothorax.  Negative for cervical spine fracture  Left occipital bone fracture.   Original Report Authenticated By: Camelia Phenes, M.D.    Ct Abdomen Pelvis W Contrast  07/12/2012  *RADIOLOGY REPORT*  Clinical Data:  Fall from ladder, left shoulder, chest and back pain  CT CHEST, ABDOMEN AND PELVIS WITH CONTRAST  Technique:  Multidetector CT imaging of the chest, abdomen and pelvis was performed following the standard protocol during bolus administration of intravenous contrast.  Contrast: OMNIPAQUE IOHEXOL 300 MG/ML  SOLN  Comparison:  07/12/2012  CT CHEST  Findings:  Left rib fractures noted involving the left fourth, fifth, sixth, ninth, tenth, and eleventh ribs.  There is also an associated diffuse minimally comminuted left scapular fracture. Scapula fracture involves the glenoid surface.  No shoulder malalignment.  Small left pneumothorax evident with scattered peripheral left upper lobe and left lower lobe atelectasis/contusion.  Right base atelectasis also evident.  No right pneumothorax.  Trachea and airways are patent.  Previous coronary bypass changes noted.  No mediastinal hemorrhage or hematoma.  Thoracic aorta appears intact.  Normal heart size. No pericardial effusion.  Left chest subcutaneous emphysema noted.  IMPRESSION: Numerous acute left rib fractures with a small left pneumothorax and peripheral left lung atelectasis/contusion.  Diffuse left scapula fracture.  Right base atelectasis as well.  CT ABDOMEN AND PELVIS  Findings:  Calcified  gallstones noted within the gallbladder dependently.  Liver, biliary system, pancreas, spleen, adrenal glands, and kidneys are within normal limits for age and demonstrate no acute finding.  Negative for bowel obstruction, dilatation, ileus pattern, free air, or central mesenteric abnormality.  No abdominal free fluid, fluid collection, hemorrhage, adenopathy, or abscess.  Scattered colonic diverticulosis.  Atherosclerosis of the aorta without aneurysm or dissection.  Pelvis:  Sigmoid diverticulosis evident.  No pelvic free fluid, hemorrhage, hematoma, adenopathy, inguinal abnormality, hernia. Urinary bladder unremarkable.  Osseous structures:  Acute superior endplate mild compression fractures noted at T11, T12, L1, and L2.  Left transverse process fractures at L1 and L2.  Posterior spinous process fractures at T11, T12 and L1.  Posterior lamina and facet fractures at T12 and L1.  These are best appreciated on the sagittal reconstructions. Facets appear aligned.  No malalignment, subluxation or dislocation.  No significant retropulsion or canal narrowing. Pelvis appears intact.  Hips are located.  IMPRESSION: T11, T12, L1, and L2 spinal fractures as described.  Cholelithiasis  No acute intra-abdominal or pelvic injury.  Diverticulosis  Critical Value/emergent results were called by telephone at the time of interpretation on 07/12/2012 at 3:30 p.m. to Dr.Pickering, who verbally acknowledged these  results.   Original Report Authenticated By: Judie Petit. Ruel Favors, M.D.    Dg Chest Port 1 View  07/13/2012  *RADIOLOGY REPORT*  Clinical Data: History of rib fractures, follow-up  PORTABLE CHEST - 1 VIEW  Comparison: Portable chest x-ray of 07/12/2012 and CT chest of the same date  Findings: Acute fractures of the lateral left fourth, fifth, sixth ribs again are noted.  No definite pneumothorax is seen by portable chest x-ray.  There does appear to be a small left pleural effusion with left basilar atelectasis consistent  with atelectasis and possibly contusion.  Mild volume loss at the right lung base is noted.  The heart is mildly enlarged.  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  IMPRESSION:  1.  Little change to slight decrease in aeration with basilar atelectasis, left greater than right, and possible left basilar contusion with small effusion. 2.  No definite pneumothorax.  No change in acute left posterolateral left rib fractures.   Original Report Authenticated By: Dwyane Dee, M.D.    Dg Chest Port 1 View  07/12/2012  **ADDENDUM** CREATED: 07/12/2012 15:38:13  There is mild displaced fracture of the left acromion.  Question nondisplaced fracture of the left scapula.  **END ADDENDUM** SIGNED BY: Natasha Mead, M.D.   07/12/2012  *RADIOLOGY REPORT*  Clinical Data: Fall  PORTABLE CHEST - 1 VIEW  Comparison: 01/21/2011  Findings: Study is limited by poor inspiration.  Status post CABG again noted.  Displaced fracture or of the left fourth fifth and sixth ribs.  No diagnostic pneumothorax.  Mild left basilar atelectasis or lung contusion. Probable calcified gallstones in the right upper quadrant of the abdomen.  IMPRESSION: Displaced fracture of the left fourth fifth and sixth ribs.  Mild left basilar atelectasis or lung contusion.  Status post CABG.   Original Report Authenticated By: Natasha Mead, M.D.    Dg Shoulder Left  07/12/2012  *RADIOLOGY REPORT*  Clinical Data: Post fall  LEFT SHOULDER - 2+ VIEW  Comparison: Chest x-rays same day  Findings: Mild displaced fracture of the left fourth fifth and fifth ribs.  Minimal displaced fracture of the left scapula.  Mild displaced fracture of the left acromion.  IMPRESSION: Mild displaced fracture of the left fourth fifth and fifth ribs. Minimal displaced fracture of the left scapula.  Mild displaced fracture of the left acromion.   Original Report Authenticated By: Natasha Mead, M.D.     Anti-infectives: Anti-infectives    None       Assessment/Plan: Fall Multiple L rib Fxs - pulmonary toilet, IS, add Tramadol and BDs L scapula Fx - non-op per Dr. Carola Frost T11, T12, L1, L2 Fx - bedrest until Monday then up with brace per Dr. [facility] and [facility] - neuro exam stable FEN - likely some ileus, continue clears VTE - PAS ([facility]) I spoke to his daughter at the bedside and reviewed his films with her  LOS: 2 days    Violeta Gelinas, MD, MPH, FACS Pager: [phone]  07/14/2012

## 2012-07-14 NOTE — Progress Notes (Signed)
Orthopedic Tech Progress Note Patient Details:  KERMITT HARJO 02/19/1946 [id number]  Patient ID: [name], male   DOB: May 24, 1946, 66 y.o.   MRN: [id number] Brace order completed by Storm Frisk, Carrick Rijos 07/14/2012, 3:50 PM

## 2012-07-14 NOTE — Consult Note (Signed)
I have seen and examined the patient. I agree with the findings above.  Budd Palmer, MD 07/14/2012 5:08 PM

## 2012-07-15 LAB — CBC
HCT: 31.5 % — ABNORMAL LOW (ref 39.0–52.0)
Hemoglobin: 10.8 g/dL — ABNORMAL LOW (ref 13.0–17.0)
MCH: 30.7 pg (ref 26.0–34.0)
MCHC: 34.3 g/dL (ref 30.0–36.0)
MCV: 89.5 fL (ref 78.0–100.0)
Platelets: 173 10*3/uL (ref 150–400)
RBC: 3.52 MIL/uL — ABNORMAL LOW (ref 4.22–5.81)
RDW: 12.5 % (ref 11.5–15.5)
WBC: 7.4 10*3/uL (ref 4.0–10.5)

## 2012-07-15 LAB — BASIC METABOLIC PANEL
BUN: 10 mg/dL (ref 6–23)
CO2: 25 mEq/L (ref 19–32)
Calcium: 8.3 mg/dL — ABNORMAL LOW (ref 8.4–10.5)
Chloride: 97 mEq/L (ref 96–112)
Creatinine, Ser: 0.54 mg/dL (ref 0.50–1.35)
GFR calc Af Amer: >90 mL/min (ref >90)
GFR calc non Af Amer: >90 mL/min (ref >90)
Glucose, Bld: 137 mg/dL — ABNORMAL HIGH (ref 70–99)
Potassium: 4.5 mEq/L (ref 3.5–5.1)
Sodium: 132 mEq/L — ABNORMAL LOW (ref 135–145)

## 2012-07-15 MED ORDER — ACETAMINOPHEN 325 MG PO TABS
650.0000 mg | ORAL_TABLET | Freq: Four times a day (QID) | ORAL | Status: DC
  Administered 2012-07-15 – 2012-07-16 (×3): 650 mg via ORAL
  Filled 2012-07-15 (×2): qty 2

## 2012-07-15 MED ORDER — POLYETHYLENE GLYCOL 3350 17 G PO PACK
17.0000 g | PACK | Freq: Every day | ORAL | Status: DC
  Administered 2012-07-15 – 2012-07-18 (×4): 17 g via ORAL
  Filled 2012-07-15 (×4): qty 1

## 2012-07-15 NOTE — Plan of Care (Signed)
Problem: Phase I Progression Outcomes Goal: OOB as tolerated unless otherwise ordered Outcome: Not Progressing 11/2: Physicians order for strict bed rest

## 2012-07-15 NOTE — Progress Notes (Signed)
PT said he can not wear our mask but doesn't have his mask with him for his cpap. I tried to get him to try our mask and he refused.

## 2012-07-15 NOTE — Progress Notes (Signed)
Trauma Service Note  Subjective: Patient is stable, in no acute distress.  Says he feels better than yesterday.  Objective: Vital signs in last 24 hours: Temp:  [97.7 F (36.5 C)-99 F (37.2 C)] 98.5 F (36.9 C) (11/02 0804) Pulse Rate:  [79-94] 94  (11/02 0700) Resp:  [12-23] 14  (11/02 0837) BP: (91-129)/(41-72) 129/64 mmHg (11/02 0700) SpO2:  [92 %-100 %] 97 % (11/02 0837) FiO2 (%):  [35 %-40 %] 40 % (11/02 0837) Last BM Date: 07/12/12  Intake/Output from previous day: 11/01 0701 - 11/02 0700 In: 1713.4 [P.O.:460; I.V.:1253.4] Out: 675 [Urine:675] Intake/Output this shift:    General: No acute distress  Lungs: Clear to auscultation.  No CXR today  Abd: Soft, mildly distended, non-tender, good bowel sounds.  Extremities: No DVT signs or symptoms  Neuro: Intact  Lab Results: CBC   Basename 07/15/12 0443 07/14/12 0425  WBC 7.4 8.3  HGB 10.8* 10.4*  HCT 31.5* 30.8*  PLT 173 165   BMET  Basename 07/15/12 0443 07/14/12 0425  NA 132* 133*  K 4.5 4.3  CL 97 100  CO2 25 26  GLUCOSE 137* 141*  BUN 10 12  CREATININE 0.54 0.61  CALCIUM 8.3* 8.5   PT/INR No results found for this basename: LABPROT:2,INR:2 in the last 72 hours ABG No results found for this basename: PHART:2,PCO2:2,PO2:2,HCO3:2 in the last 72 hours  Studies/Results: No results found.  Anti-infectives: Anti-infectives    None      Assessment/Plan: s/p  Transfer to 4N. CPM otherwise.  LOS: 3 days   Marta Lamas. Gae Bon, MD, FACS [phone] Trauma Surgeon 07/15/2012

## 2012-07-15 NOTE — Progress Notes (Signed)
Patient ID: Rodney Riley, male   DOB: Aug 24, 1946, 66 y.o.   MRN: [id number] Neuro stable. Dr Lindie Spruce to transfer him to the floor

## 2012-07-15 NOTE — Progress Notes (Signed)
Pt. found on Venti-mask@40 %/12lpm, sats 96%, etco2@28 , rr-18,  PCA in use no bipap/cpap found in room, does not use @ home, will plan to use prn. Wife stated that ,"etco2 monitor migrated off face last nite causing PCA to lock from sensing < rr", adjusted etco2 device, told to notify if needed, plan to pass on in report.

## 2012-07-15 NOTE — Progress Notes (Signed)
Orthopedic Tech Progress Note Patient Details:  MASAMI PLATA Apr 08, 1946 [id number]  Patient ID: [name], male   DOB: Sep 02, 1946, 66 y.o.   MRN: [id number]   Shawnie Pons 07/15/2012, 10:15 AM TLSO WITH LINERS COMPLETED BY [employer]

## 2012-07-16 MED ORDER — ONDANSETRON HCL 4 MG/2ML IJ SOLN
4.0000 mg | INTRAMUSCULAR | Status: DC | PRN
  Administered 2012-07-16 – 2012-07-18 (×5): 4 mg via INTRAVENOUS
  Filled 2012-07-16 (×5): qty 2

## 2012-07-16 MED ORDER — HYDROCODONE-ACETAMINOPHEN 5-325 MG PO TABS: 1.0000 | ORAL_TABLET | ORAL | Status: DC | PRN

## 2012-07-16 MED ORDER — MORPHINE SULFATE 2 MG/ML IJ SOLN: 1.0000 mg | INTRAMUSCULAR | Status: DC | PRN

## 2012-07-16 MED ORDER — ACETAMINOPHEN 325 MG PO TABS: 650.0000 mg | ORAL_TABLET | Freq: Four times a day (QID) | ORAL | Status: DC | PRN

## 2012-07-16 MED ORDER — METOCLOPRAMIDE HCL 5 MG/ML IJ SOLN
5.0000 mg | Freq: Four times a day (QID) | INTRAMUSCULAR | Status: DC | PRN
  Administered 2012-07-16: 5 mg via INTRAVENOUS
  Filled 2012-07-16: qty 1

## 2012-07-16 NOTE — Progress Notes (Signed)
Meds adjusted due to MS effects Patient examined and I agree with the assessment and plan  Violeta Gelinas, MD, MPH, FACS Pager: [phone]  07/16/2012 1:03 PM

## 2012-07-16 NOTE — Progress Notes (Signed)
Trauma Service Note  Subjective: Patient is stable, in no acute distress.  Feels "spacy" this am, family reports he has had some hallucinations and confusion, thinks it may be the pain meds.  Somewhat more pain today.  Objective: Vital signs in last 24 hours: Temp:  [97.5 F (36.4 C)-99.2 F (37.3 C)] 97.6 F (36.4 C) (11/03 0952) Pulse Rate:  [82-107] 94  (11/03 0952) Resp:  [12-22] 20  (11/03 0952) BP: (112-163)/(32-123) 146/68 mmHg (11/03 0952) SpO2:  [90 %-99 %] 96 % (11/03 0952) FiO2 (%):  [40 %] 40 % (11/02 2041) Last BM Date: 07/12/12  Intake/Output from previous day: 11/02 0701 - 11/03 0700 In: 340 [I.V.:340] Out: 700 [Urine:700] Intake/Output this shift:    General: No acute distress Chest: tender along left flank Lungs: Clear to auscultation.  No CXR today Abd: Soft, mildly distended, non-tender, good bowel sounds. Extremities: No DVT signs or symptoms Neuro: Intact  Lab Results: CBC   Basename 07/15/12 0443 07/14/12 0425  WBC 7.4 8.3  HGB 10.8* 10.4*  HCT 31.5* 30.8*  PLT 173 165   BMET  Basename 07/15/12 0443 07/14/12 0425  NA 132* 133*  K 4.5 4.3  CL 97 100  CO2 25 26  GLUCOSE 137* 141*  BUN 10 12  CREATININE 0.54 0.61  CALCIUM 8.3* 8.5   PT/INR No results found for this basename: LABPROT:2,INR:2 in the last 72 hours ABG No results found for this basename: PHART:2,PCO2:2,PO2:2,HCO3:2 in the last 72 hours  Studies/Results: No results found.  Anti-infectives: Anti-infectives    None      Assessment/Plan: 1. Fall from ladder: some confusion and feeling spacy, still with nausea but may be from pain meds also.  Still on bedrest today, start mobilizing with PT tomorrow  --d/c pca  --change oxycodone to norco  --add morphine IV for breakthrough pain  --add reglan to help with nausea  --change zofran to q4 hrs.   --start PT tomorrow.   LOS: 4 days    WHITE, ELIZABETH  07/16/2012

## 2012-07-17 ENCOUNTER — Inpatient Hospital Stay (HOSPITAL_COMMUNITY): Payer: Medicare Other

## 2012-07-17 MED ORDER — HYDROCODONE-ACETAMINOPHEN 5-325 MG PO TABS
0.5000 | ORAL_TABLET | ORAL | Status: DC | PRN
  Administered 2012-07-17 – 2012-07-18 (×3): 2 via ORAL
  Filled 2012-07-17 (×3): qty 2

## 2012-07-17 MED ORDER — MORPHINE SULFATE 2 MG/ML IJ SOLN: 2.0000 mg | INTRAMUSCULAR | Status: DC | PRN

## 2012-07-17 MED ORDER — BISACODYL 5 MG PO TBEC
5.0000 mg | DELAYED_RELEASE_TABLET | Freq: Every day | ORAL | Status: DC
  Administered 2012-07-17 – 2012-07-18 (×2): 5 mg via ORAL
  Filled 2012-07-17 (×2): qty 1

## 2012-07-17 MED ORDER — DOCUSATE SODIUM 100 MG PO CAPS
200.0000 mg | ORAL_CAPSULE | Freq: Two times a day (BID) | ORAL | Status: DC
  Administered 2012-07-17 – 2012-07-18 (×2): 200 mg via ORAL
  Filled 2012-07-17: qty 2
  Filled 2012-07-17: qty 1

## 2012-07-17 NOTE — Progress Notes (Signed)
UR completed 

## 2012-07-17 NOTE — Progress Notes (Signed)
Subjective: Patient resting comfortably in bed. He feels that he is having less back discomfort. TLSO at bedside.  Objective: Vital signs in last 24 hours: Filed Vitals:   07/17/12 0530 07/17/12 0600 07/17/12 0824 07/17/12 1017  BP:  153/83  157/76  Pulse:  88  97  Temp:  97.4 F (36.3 C)  98 F (36.7 C)  TempSrc:  Oral  Oral  Resp:  18  18  Height:      Weight:      SpO2: 95% 98% 96% 100%    Intake/Output from previous day: 11/03 0701 - 11/04 0700 In: 420 [P.O.:420] Out: 750 [Urine:750] Intake/Output this shift:    Physical Exam:  Patient is awake alert, oriented to name, [facility] hospital, and 2013. Extraocular movements intact. Facial movements symmetrical. Moving all 4 extremities well.  CBC  Basename 07/15/12 0443  WBC 7.4  HGB 10.8*  HCT 31.5*  PLT 173   BMET  Basename 07/15/12 0443  NA 132*  K 4.5  CL 97  CO2 25  GLUCOSE 137*  BUN 10  CREATININE 0.54  CALCIUM 8.3*    Studies/Results: Ct Head Wo Contrast  07/17/2012  *RADIOLOGY REPORT*  Clinical Data: Intracranial hemorrhage.  CT HEAD WITHOUT CONTRAST  Technique:  Contiguous axial images were obtained from the base of the skull through the vertex without contrast.  Comparison: 07/13/2012  Findings: Subtle areas of subarachnoid hemorrhage in the posterior temporal and posterior parietal regions bilaterally slightly more prominent.  The tiny right temporal subdural hemorrhage is less apparent.  Edema in the posterior parietal lobes bilaterally as indicated by less prominent cortical sulci than on the study of 07/12/2012.  Probable hairline fracture of the left side of the occipital bone is again noted.  IMPRESSION: Slightly increased subarachnoid hemorrhage.  Increased edema in the posterior parietal lobes without midline shift.   Original Report Authenticated By: Francene Boyers, M.D.    Dg Chest Port 1 View  07/17/2012  *RADIOLOGY REPORT*  Clinical Data: Follow up multiple rib fractures  PORTABLE CHEST - 1  VIEW  Comparison: Chest x-ray 07/13/2012  Findings: No pneumothorax.  Small left pleural effusion and left lower lobe atelectasis.  The right diaphragm is elevated. Unchanged cardiomegaly.  Median sternotomy with evidence of prior multivessel CABG including LIMA bypass.  Healing mildly displaced fractures of the posterior aspects of left ribs three through six. Nondisplaced fractures of the posterior aspects of the lower left ribs better demonstrated on prior CT.  IMPRESSION: 1.  Multiple healing left-sided rib fractures. No pneumothorax. 2.  Left basilar opacity and small left pleural effusion.  The opacity likely represents atelectasis.  Infection is difficult to exclude in the appropriate clinical setting. 3.  Mild cardiomegaly, unchanged.   Original Report Authenticated By: Malachy Moan, M.D.     Assessment/Plan: CT this morning shows some clearing of mild traumatic intracranial hemorrhage. Stable neurologically. We'll need to mobilize with PT and OT, TLSO will need to be donned and doffed in bed, patient and his wife will need to be instructed in the technique of doing so. We'll need an upright AP and lateral thoracolumbar junction x-rays once mobilized.   Hewitt Shorts, MD 07/17/2012, 10:22 AM

## 2012-07-17 NOTE — Progress Notes (Signed)
Rehab Admissions Coordinator Note:  Patient was screened by Meryl Dare for appropriateness for an Inpatient Acute Rehab Consult.  At this time, we are recommending Inpatient Rehab consult.  Meryl Dare 07/17/2012, 3:47 PM  I can be reached at [phone].

## 2012-07-17 NOTE — Evaluation (Signed)
Occupational Therapy Evaluation Patient Details Name: Rodney Riley MRN: [id number] DOB: [birth year] Today's Date: 07/17/2012 Time: 7829-5621 OT Time Calculation (min): 46 min  OT Assessment / Plan / Recommendation Clinical Impression  66 yo male s/p fall from ladder while cleaning gutters and mold off siding. Pt currently with occipital fx, Rt SDH temporal lobe, Lt rib fx and Comminuted left scapular fracture with glenoid involvement. Recommend HHOT for d/c planning     OT Assessment  Patient needs continued OT Services    Follow Up Recommendations  Home health OT    Barriers to Discharge      Equipment Recommendations  3 in 1 bedside comode;Rolling walker with 5" wheels    Recommendations for Other Services    Frequency  Min 3X/week    Precautions / Restrictions Precautions Precautions: Back;Shoulder Type of Shoulder Precautions: AROM to comfort Precaution Booklet Issued:  (back handout provided) Precaution Comments: Lt shoulder sling for comfort- ROM as patient can tolerate Required Braces or Orthoses: Spinal Brace Spinal Brace: Thoracolumbosacral orthotic;Applied in supine position Restrictions Weight Bearing Restrictions:  (no orders) Other Position/Activity Restrictions: limited LT UE weight bearing- need clarification   Pertinent Vitals/Pain 5 out 10 reports pain all over but the greatest pain is in Lt rib cage area    ADL  Grooming: Performed;Set up Where Assessed - Grooming: Supported sitting Lower Body Dressing: Performed;+1 Total assistance Where Assessed - Lower Body Dressing: Supine, head of bed up Toilet Transfer: Simulated;+2 Total assistance Toilet Transfer: Patient Percentage: 50% (elevated surface) Toilet Transfer Method: Sit to stand Toilet Transfer Equipment: Raised toilet seat with arms (or 3-in-1 over toilet) Equipment Used: Gait belt;Back brace Transfers/Ambulation Related to ADLs: pt ambulated with posterior lean and rt lean. pt required max  v/c for posterior and facilitation to shift weight. pt reports Rt LE stronger than Lt LE at this time. ADL Comments: Pt supine on arrival with damp gown on. Gown doff and new gown applied. Pt educated on don brace in supine. Pt wife present to observe. Pt rolling to the Rt better than Lt due to rib fxs. Pt total +2 to don TLSO. pt max (A) supine <> sit with immediate vomitting. Pt with large volume of liquid vomit. Pt with vomitting resolving with prolonged sitting and gaze stabilization. Pt required (A) to sit at EOB and presented with posterior right lean. Pt elevated bed for sit <>Stand. Pt returned to chair after first attempt and agreeable to second ambulation attempt. Pt positioned in chair with grooming items at end of session. Wife (A)ing patient with oral care.    OT Diagnosis: Generalized weakness;Acute pain  OT Problem List: Decreased strength;Decreased activity tolerance;Impaired balance (sitting and/or standing);Decreased safety awareness;Decreased knowledge of use of DME or AE;Decreased knowledge of precautions;Pain OT Treatment Interventions: Self-care/ADL training;DME and/or AE instruction;Therapeutic activities;Patient/family education;Balance training   OT Goals Acute Rehab OT Goals OT Goal Formulation: With patient/family Time For Goal Achievement: 07/31/12 Potential to Achieve Goals: Good ADL Goals Pt Will Perform Grooming: with set-up;Standing at sink (maintain back precautions) ADL Goal: Grooming - Progress: Goal set today Pt Will Perform Upper Body Bathing: with set-up;Supine, head of bed up;Supported;with adaptive equipment ADL Goal: Upper Body Bathing - Progress: Goal set today Pt Will Perform Upper Body Dressing: with set-up;Supine, head of bed up;Supported;with adaptive equipment ADL Goal: Upper Body Dressing - Progress: Goal set today Pt Will Transfer to Toilet: with min assist;3-in-1;Ambulation ADL Goal: Toilet Transfer - Progress: Goal set today Pt Will Perform  Toileting - Clothing Manipulation:  with min assist;with adaptive equipment;Sitting on 3-in-1 or toilet ADL Goal: Toileting - Clothing Manipulation - Progress: Goal set today Pt Will Perform Toileting - Hygiene: with min assist;Sit to stand from 3-in-1/toilet ADL Goal: Toileting - Hygiene - Progress: Goal set today Miscellaneous OT Goals Miscellaneous OT Goal #1: Pt will complete bed mobility mod I as precursor to adls OT Goal: Miscellaneous Goal #1 - Progress: Goal set today Miscellaneous OT Goal #2: pt will don / doff brace mod I with wife (A) PRN as precursor to adls OT Goal: Miscellaneous Goal #2 - Progress: Goal set today  Visit Information  Last OT Received On: 07/17/12 Assistance Needed: +2 PT/OT Co-Evaluation/Treatment: Yes    Subjective Data  Subjective: "So yall are going to get me up slow right?" Patient Stated Goal: to return home soon   Prior Functioning     Home Living Lives With: Spouse Available Help at Discharge: Available 24 hours/day;Family Type of Home: House Home Access: Stairs to enter [employer] of Steps: 2 Entrance Stairs-Rails: None Home Layout: Two level Alternate Level Stairs-Number of Steps: 13 Alternate Level Stairs-Rails: Left;Right Bathroom Shower/Tub: Walk-in shower;Door Dentist: None Prior Function Level of Independence: Independent Able to Take Stairs?: Yes Driving: Yes Vocation: Retired Musician: No difficulties Dominant Hand: Right         Vision/Perception Vision - Assessment Eye Alignment: Within Functional Limits Vision Assessment:  (not formally tested due to vomitting)   Cognition  Overall Cognitive Status: Appears within functional limits for tasks assessed/performed Arousal/Alertness: Awake/alert Orientation Level: Appears intact for tasks assessed Behavior During Session: [facility] for tasks performed    Extremity/Trunk Assessment Right Upper  Extremity Assessment RUE ROM/Strength/Tone: Within functional levels Left Upper Extremity Assessment LUE ROM/Strength/Tone: Due to pain;Deficits (wrist/elbow WFL, shld limited by pain) LUE ROM/Strength/Tone Deficits: not fully assessed due to fx, ROM ~80 degrees AROm during session Right Lower Extremity Assessment RLE ROM/Strength/Tone: Within functional levels Left Lower Extremity Assessment LLE ROM/Strength/Tone: Within functional levels Trunk Assessment Trunk Assessment:  (pt in TLSO)     Mobility Bed Mobility Bed Mobility: Not assessed (pt received sitting EOB) Rolling Right: 4: Min assist;With rail Right Sidelying to Sit: 2: Max assist;HOB flat Sitting - Scoot to [place] of Bed: 3: Mod assist Details for Bed Mobility Assistance: pt with all rolling completed to the right side due to lt rib fx and lt shoulder injury. Transfers Transfers: Sit to Stand;Stand to Sit Sit to Stand: 1: +2 Total assist;With upper extremity assist;From bed Sit to Stand: Patient Percentage: 50% Stand to Sit: 1: +2 Total assist;With upper extremity assist;To elevated surface;To chair/3-in-1 Stand to Sit: Patient Percentage: 50% Details for Transfer Assistance: elevated bed, pt with positive lean to R     Shoulder Instructions     Exercise     Balance Balance Balance Assessed: Yes Static Sitting Balance Static Sitting - Balance Support: Right upper extremity supported;Feet supported Static Sitting - Level of Assistance: 3: Mod assist Static Sitting - Comment/# of Minutes: 5 min Static Standing Balance Static Standing - Balance Support: Right upper extremity supported;Left upper extremity supported;Bilateral upper extremity supported Static Standing - Level of Assistance: 1: +2 Total assist   End of Session OT - End of Session Activity Tolerance: Patient tolerated treatment well Patient left: in chair;with call bell/phone within reach;with family/visitor present Nurse Communication: Mobility  status;Precautions  GO    Question: vestibular issues due to vomiting with side<>sit during session. OT to assess vestibular and vision in more  detail next session. Pt progressing well for first attempt. Pt will need continued services due to balance deficits. Harrel Carina [facility] 07/17/2012, 3:17 PM Pager: [phone]

## 2012-07-17 NOTE — Evaluation (Signed)
Physical Therapy Evaluation Patient Details Name: Rodney Riley MRN: [id number] DOB: 09-23-1945 Today's Date: 07/17/2012 Time: 4098-1191 PT Time Calculation (min): 24 min  PT Assessment / Plan / Recommendation Clinical Impression  Pt s/p fall from ladder with L scapular fx, occipital fx, T11, 12, L1 fx requiring use of TLSO. Pt  presenting with dizziness, proprioception deficits, decreased strength and impaired balance now requiring assist for all mobility at this time. Patient to benefit from skilled PT upon d/c to maximize functional return for safe transition home. Pt I prior to admit and would like to return to I function.    PT Assessment  Patient needs continued PT services    Follow Up Recommendations  Supervision/Assistance - 24 hour;Post acute inpatient    Does the patient have the potential to tolerate intense rehabilitation   Yes, Recommend IP Rehab Screening  Barriers to Discharge        Equipment Recommendations  3 in 1 bedside comode;Rolling walker with 5" wheels    Recommendations for Other Services Rehab consult   Frequency Min 5X/week    Precautions / Restrictions Precautions Precautions: Back;Shoulder Type of Shoulder Precautions: AROM to comfort Precaution Booklet Issued:  (back handout provided) Precaution Comments: Lt shoulder sling for comfort- ROM as patient can tolerate Required Braces or Orthoses: Spinal Brace Spinal Brace: Thoracolumbosacral orthotic;Applied in supine position Restrictions Weight Bearing Restrictions:  (no orders) Other Position/Activity Restrictions: limited LT UE weight bearing- need clarification   Pertinent Vitals/Pain 5/10 back pain      Mobility  Bed Mobility Bed Mobility: Not assessed (pt received sitting EOB) Rolling Right: 4: Min assist;With rail Right Sidelying to Sit: 2: Max assist;HOB flat Sitting - Scoot to [place] of Bed: 3: Mod assist Details for Bed Mobility Assistance: pt with all rolling completed to the  right side due to lt rib fx and lt shoulder injury. Transfers Transfers: Sit to Stand;Stand to Sit Sit to Stand: 1: +2 Total assist;With upper extremity assist;From bed Sit to Stand: Patient Percentage: 50% Stand to Sit: 1: +2 Total assist;With upper extremity assist;To elevated surface;To chair/3-in-1 Stand to Sit: Patient Percentage: 50% Details for Transfer Assistance: elevated bed, pt with positive lean to R Ambulation/Gait Ambulation/Gait Assistance: 1: +2 Total assist Ambulation/Gait: Patient Percentage: 60% Ambulation Distance (Feet):  (2'x1, 5'x1) Assistive device: 2 person hand held assist Ambulation/Gait Assistance Details: pt with increased weight-shift to R, minimal Wbing through L UE Gait Pattern: Step-to pattern;Decreased stride length;Decreased stance time - left;Decreased stance time - right Gait velocity: slow General Gait Details: pt had to complete gaze stabilization to assist with dizziness Stairs: No    Shoulder Instructions     Exercises     PT Diagnosis: Difficulty walking;Acute pain;Generalized weakness  PT Problem List: Decreased strength;Decreased range of motion;Decreased activity tolerance;Decreased mobility PT Treatment Interventions: DME instruction;Functional mobility training;Therapeutic activities;Therapeutic exercise;Stair training;Gait training   PT Goals Acute Rehab PT Goals PT Goal Formulation: With patient/family Time For Goal Achievement: 07/31/12 Potential to Achieve Goals: Good Pt will Roll Supine to Right Side: with modified independence PT Goal: Rolling Supine to Right Side - Progress: Goal set today Pt will go Supine/Side to Sit: with supervision;with HOB 0 degrees PT Goal: Supine/Side to Sit - Progress: Goal set today Pt will go Sit to Supine/Side: with supervision;with HOB 0 degrees PT Goal: Sit to Supine/Side - Progress: Goal set today Pt will go Sit to Stand: with min assist;with upper extremity assist PT Goal: Sit to Stand -  Progress: Goal set today Pt  will Ambulate: 51 - 150 feet;with least restrictive assistive device;with min assist PT Goal: Ambulate - Progress: Goal set today Pt will Go Up / Down Stairs: Flight;with min assist;with rail(s) PT Goal: Up/Down Stairs - Progress: Goal set today Additional Goals Additional Goal #1: Pt and spouse I with don/doffing TLSO PT Goal: Additional Goal #1 - Progress: Goal set today  Visit Information  Last PT Received On: 07/17/12 Assistance Needed: +2    Subjective Data  Subjective: Pt received EOB with TLSO on with OT. Pt with positive nausea/vomitting   Prior Functioning  Home Living Lives With: Spouse Available Help at Discharge: Available 24 hours/day;Family Type of Home: House Home Access: Stairs to enter [employer] of Steps: 2 Entrance Stairs-Rails: None Home Layout: Two level Alternate Level Stairs-Number of Steps: 13 Alternate Level Stairs-Rails: Left;Right Bathroom Shower/Tub: Walk-in shower;Door Dentist: None Prior Function Level of Independence: Independent Able to Take Stairs?: Yes Driving: Yes Vocation: Retired Musician: No difficulties Dominant Hand: Right    Cognition  Overall Cognitive Status: Appears within functional limits for tasks assessed/performed Arousal/Alertness: Awake/alert Orientation Level: Appears intact for tasks assessed Behavior During Session: [facility] for tasks performed    Extremity/Trunk Assessment Right Upper Extremity Assessment RUE ROM/Strength/Tone: Within functional levels Left Upper Extremity Assessment LUE ROM/Strength/Tone: Due to pain;Deficits (wrist/elbow WFL, shld limited by pain) LUE ROM/Strength/Tone Deficits: not fully assessed due to fx, ROM ~80 degrees AROm during session Right Lower Extremity Assessment RLE ROM/Strength/Tone: Within functional levels Left Lower Extremity Assessment LLE ROM/Strength/Tone: Within functional  levels Trunk Assessment Trunk Assessment:  (pt in TLSO)   Balance Balance Balance Assessed: Yes Static Sitting Balance Static Sitting - Balance Support: Right upper extremity supported;Feet supported Static Sitting - Level of Assistance: 3: Mod assist Static Sitting - Comment/# of Minutes: 5 min Static Standing Balance Static Standing - Balance Support: Right upper extremity supported;Left upper extremity supported;Bilateral upper extremity supported Static Standing - Level of Assistance: 1: +2 Total assist  End of Session PT - End of Session Equipment Utilized During Treatment: Gait belt;Back brace Activity Tolerance: Patient tolerated treatment well Patient left: in chair;with call bell/phone within reach;with family/visitor present Nurse Communication: Mobility status  GP     Marcene Brawn 07/17/2012, 3:25 PM  Lewis Shock, PT, DPT Pager #: [phone] Office #: [phone]

## 2012-07-17 NOTE — Progress Notes (Signed)
Patient ID: Rodney Riley, male   DOB: 1946/09/05, 66 y.o.   MRN: [id number]   LOS: 5 days   Subjective: No new c/o. Pain controlled.  Objective: Vital signs in last 24 hours: Temp:  [97.2 F (36.2 C)-98.3 F (36.8 C)] 98 F (36.7 C) (11/04 1017) Pulse Rate:  [88-97] 97  (11/04 1017) Resp:  [18-20] 18  (11/04 1017) BP: (151-165)/(72-90) 157/76 mmHg (11/04 1017) SpO2:  [95 %-100 %] 100 % (11/04 1017) Last BM Date: 07/12/12  IS:  Radiology PORTABLE CHEST - 1 VIEW  Comparison: Chest x-ray 07/13/2012  Findings: No pneumothorax. Small left pleural effusion and left  lower lobe atelectasis. The right diaphragm is elevated.  Unchanged cardiomegaly. 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 Healing mildly displaced  fractures of the posterior aspects of left ribs three through six.  Nondisplaced fractures of the posterior aspects of the lower left  ribs better demonstrated on prior CT.  IMPRESSION:  1. Multiple healing left-sided rib fractures. No pneumothorax.  2. Left basilar opacity and small left pleural effusion. The  opacity likely represents atelectasis. Infection is difficult to  exclude in the appropriate clinical setting.  3. Mild cardiomegaly, unchanged.  Original Report Authenticated By: Malachy Moan, M.D.   CT HEAD WITHOUT CONTRAST  Technique: Contiguous axial images were obtained from the base of  the skull through the vertex without contrast.  Comparison: 07/13/2012  Findings: Subtle areas of subarachnoid hemorrhage in the posterior  temporal and posterior parietal regions bilaterally slightly more  prominent. The tiny right temporal subdural hemorrhage is less  apparent.  Edema in the posterior parietal lobes bilaterally as indicated by  less prominent cortical sulci than on the study of 07/12/2012.  Probable hairline fracture of the left side of the occipital bone  is again noted.  IMPRESSION:  Slightly  increased subarachnoid hemorrhage. Increased edema in the  posterior parietal lobes without midline shift.  Original Report Authenticated By: Francene Boyers, M.D.   General appearance: alert and no distress Resp: clear to auscultation bilaterally Cardio: regular rate and rhythm GI: normal findings: bowel sounds normal and soft, non-tender   Assessment/Plan: Fall  Multiple L rib Fxs  L scapula Fx - non-op per Dr. Carola Frost  T11, T12, L1, L2 Fx - Up with brace per Dr. Newell Coral. Will need upright films in brace, will order tomorrow. Small R SAH and SDH - neuro exam stable, HCT improved FEN - Increase bowel regimen VTE - PAS (SDH) Dispo -- PT/OT    Freeman Caldron, PA-C Pager: [phone] General Trauma PA Pager: [phone]   07/17/2012     Patient anxious to get up with PT.  Otherwise doing okay.  This patient has been seen and I agree with the findings and treatment plan.  Marta Lamas. Gae Bon, MD, FACS [phone] (pager) [phone] (direct pager) Trauma Surgeon

## 2012-07-18 ENCOUNTER — Inpatient Hospital Stay (HOSPITAL_COMMUNITY)
Admission: RE | Admit: 2012-07-18 | Discharge: 2012-07-25 | DRG: 945 | Disposition: A | Discharge status: Home / Self Care | Payer: Medicare Other | Source: Ambulatory Visit | Attending: Physical Medicine & Rehabilitation | Admitting: Physical Medicine & Rehabilitation

## 2012-07-18 ENCOUNTER — Inpatient Hospital Stay (HOSPITAL_COMMUNITY): Payer: Medicare Other

## 2012-07-18 DIAGNOSIS — S2249XA Multiple fractures of ribs, unspecified side, initial encounter for closed fracture: Secondary | ICD-10-CM

## 2012-07-18 DIAGNOSIS — S069X9A Unspecified intracranial injury with loss of consciousness of unspecified duration, initial encounter: Secondary | ICD-10-CM

## 2012-07-18 DIAGNOSIS — S32029A Unspecified fracture of second lumbar vertebra, initial encounter for closed fracture: Secondary | ICD-10-CM | POA: Diagnosis present

## 2012-07-18 DIAGNOSIS — S066X9A Traumatic subarachnoid hemorrhage with loss of consciousness of unspecified duration, initial encounter: Secondary | ICD-10-CM

## 2012-07-18 DIAGNOSIS — K649 Unspecified hemorrhoids: Secondary | ICD-10-CM

## 2012-07-18 DIAGNOSIS — S32019A Unspecified fracture of first lumbar vertebra, initial encounter for closed fracture: Secondary | ICD-10-CM | POA: Diagnosis present

## 2012-07-18 DIAGNOSIS — W11XXXA Fall on and from ladder, initial encounter: Secondary | ICD-10-CM

## 2012-07-18 DIAGNOSIS — Y93H9 Activity, other involving exterior property and land maintenance, building and construction: Secondary | ICD-10-CM

## 2012-07-18 DIAGNOSIS — I2510 Atherosclerotic heart disease of native coronary artery without angina pectoris: Secondary | ICD-10-CM

## 2012-07-18 DIAGNOSIS — Z79899 Other long term (current) drug therapy: Secondary | ICD-10-CM

## 2012-07-18 DIAGNOSIS — E785 Hyperlipidemia, unspecified: Secondary | ICD-10-CM

## 2012-07-18 DIAGNOSIS — S066XAA Traumatic subarachnoid hemorrhage with loss of consciousness status unknown, initial encounter: Secondary | ICD-10-CM | POA: Diagnosis present

## 2012-07-18 DIAGNOSIS — Z5189 Encounter for other specified aftercare: Principal | ICD-10-CM

## 2012-07-18 DIAGNOSIS — T1490XA Injury, unspecified, initial encounter: Secondary | ICD-10-CM

## 2012-07-18 DIAGNOSIS — E236 Other disorders of pituitary gland: Secondary | ICD-10-CM

## 2012-07-18 DIAGNOSIS — S22009A Unspecified fracture of unspecified thoracic vertebra, initial encounter for closed fracture: Secondary | ICD-10-CM

## 2012-07-18 DIAGNOSIS — I10 Essential (primary) hypertension: Secondary | ICD-10-CM

## 2012-07-18 DIAGNOSIS — S42143A Displaced fracture of glenoid cavity of scapula, unspecified shoulder, initial encounter for closed fracture: Secondary | ICD-10-CM

## 2012-07-18 DIAGNOSIS — S2242XA Multiple fractures of ribs, left side, initial encounter for closed fracture: Secondary | ICD-10-CM | POA: Diagnosis present

## 2012-07-18 DIAGNOSIS — S22089A Unspecified fracture of T11-T12 vertebra, initial encounter for closed fracture: Secondary | ICD-10-CM | POA: Diagnosis present

## 2012-07-18 DIAGNOSIS — Y92009 Unspecified place in unspecified non-institutional (private) residence as the place of occurrence of the external cause: Secondary | ICD-10-CM

## 2012-07-18 DIAGNOSIS — S065X9A Traumatic subdural hemorrhage with loss of consciousness of unspecified duration, initial encounter: Secondary | ICD-10-CM | POA: Diagnosis present

## 2012-07-18 DIAGNOSIS — S32009A Unspecified fracture of unspecified lumbar vertebra, initial encounter for closed fracture: Secondary | ICD-10-CM

## 2012-07-18 DIAGNOSIS — S02109A Fracture of base of skull, unspecified side, initial encounter for closed fracture: Secondary | ICD-10-CM

## 2012-07-18 DIAGNOSIS — K5900 Constipation, unspecified: Secondary | ICD-10-CM

## 2012-07-18 DIAGNOSIS — Z951 Presence of aortocoronary bypass graft: Secondary | ICD-10-CM

## 2012-07-18 DIAGNOSIS — W19XXXA Unspecified fall, initial encounter: Secondary | ICD-10-CM | POA: Diagnosis present

## 2012-07-18 MED ORDER — IPRATROPIUM-ALBUTEROL 18-103 MCG/ACT IN AERO
2.0000 | INHALATION_SPRAY | Freq: Two times a day (BID) | RESPIRATORY_TRACT | Status: DC
  Administered 2012-07-19 – 2012-07-24 (×11): 2 via RESPIRATORY_TRACT
  Filled 2012-07-18: qty 14.7

## 2012-07-18 MED ORDER — LISINOPRIL 10 MG PO TABS
10.0000 mg | ORAL_TABLET | Freq: Every day | ORAL | Status: DC
  Administered 2012-07-19 – 2012-07-25 (×7): 10 mg via ORAL
  Filled 2012-07-18 (×8): qty 1

## 2012-07-18 MED ORDER — ONDANSETRON HCL 4 MG PO TABS
4.0000 mg | ORAL_TABLET | Freq: Four times a day (QID) | ORAL | Status: DC | PRN
  Administered 2012-07-18 – 2012-07-21 (×5): 4 mg via ORAL
  Filled 2012-07-18 (×5): qty 1

## 2012-07-18 MED ORDER — BISACODYL 5 MG PO TBEC
5.0000 mg | DELAYED_RELEASE_TABLET | Freq: Every day | ORAL | Status: DC
  Administered 2012-07-19 – 2012-07-22 (×3): 5 mg via ORAL
  Filled 2012-07-18 (×6): qty 1

## 2012-07-18 MED ORDER — ATORVASTATIN CALCIUM 40 MG PO TABS
40.0000 mg | ORAL_TABLET | Freq: Every day | ORAL | Status: DC
  Administered 2012-07-18 – 2012-07-24 (×7): 40 mg via ORAL
  Filled 2012-07-18 (×8): qty 1

## 2012-07-18 MED ORDER — IPRATROPIUM-ALBUTEROL 18-103 MCG/ACT IN AERO
2.0000 | INHALATION_SPRAY | Freq: Four times a day (QID) | RESPIRATORY_TRACT | Status: DC
  Administered 2012-07-18: 2 via RESPIRATORY_TRACT
  Filled 2012-07-18: qty 14.7

## 2012-07-18 MED ORDER — PNEUMOCOCCAL VAC POLYVALENT 25 MCG/0.5ML IJ INJ: 0.5000 mL | INJECTION | INTRAMUSCULAR | Status: DC

## 2012-07-18 MED ORDER — ADULT MULTIVITAMIN W/MINERALS CH
1.0000 | ORAL_TABLET | Freq: Every day | ORAL | Status: DC
  Administered 2012-07-19 – 2012-07-25 (×7): 1 via ORAL
  Filled 2012-07-18 (×8): qty 1

## 2012-07-18 MED ORDER — INFLUENZA VIRUS VACC SPLIT PF IM SUSP: 0.5000 mL | INTRAMUSCULAR | Status: DC

## 2012-07-18 MED ORDER — ACETAMINOPHEN 325 MG PO TABS
325.0000 mg | ORAL_TABLET | ORAL | Status: DC | PRN
  Administered 2012-07-18 – 2012-07-21 (×5): 650 mg via ORAL
  Filled 2012-07-18 (×4): qty 2

## 2012-07-18 MED ORDER — ONDANSETRON HCL 4 MG/2ML IJ SOLN: 4.0000 mg | Freq: Four times a day (QID) | INTRAMUSCULAR | Status: DC | PRN

## 2012-07-18 MED ORDER — IPRATROPIUM-ALBUTEROL 18-103 MCG/ACT IN AERO
2.0000 | INHALATION_SPRAY | RESPIRATORY_TRACT | Status: DC | PRN
  Filled 2012-07-18: qty 14.7

## 2012-07-18 MED ORDER — HYDROCODONE-ACETAMINOPHEN 5-325 MG PO TABS
0.5000 | ORAL_TABLET | ORAL | Status: DC | PRN
  Administered 2012-07-18 – 2012-07-25 (×16): 2 via ORAL
  Filled 2012-07-18 (×8): qty 2
  Filled 2012-07-18: qty 1
  Filled 2012-07-18: qty 2
  Filled 2012-07-18: qty 1
  Filled 2012-07-18 (×6): qty 2

## 2012-07-18 MED ORDER — VITAMIN E 180 MG (400 UNIT) PO CAPS
400.0000 [IU] | ORAL_CAPSULE | Freq: Every day | ORAL | Status: DC
  Administered 2012-07-19 – 2012-07-25 (×7): 400 [IU] via ORAL
  Filled 2012-07-18 (×8): qty 1

## 2012-07-18 MED ORDER — SORBITOL 70 % SOLN
30.0000 mL | Freq: Every day | Status: DC | PRN
  Administered 2012-07-18: 30 mL via ORAL
  Filled 2012-07-18: qty 30

## 2012-07-18 MED ORDER — POLYETHYLENE GLYCOL 3350 17 G PO PACK
17.0000 g | PACK | Freq: Every day | ORAL | Status: DC
  Administered 2012-07-19 – 2012-07-22 (×4): 17 g via ORAL
  Filled 2012-07-18 (×8): qty 1

## 2012-07-18 MED ORDER — METHOCARBAMOL 500 MG PO TABS
500.0000 mg | ORAL_TABLET | Freq: Four times a day (QID) | ORAL | Status: DC | PRN
  Administered 2012-07-24 (×2): 500 mg via ORAL
  Filled 2012-07-18: qty 1
  Filled 2012-07-18: qty 4

## 2012-07-18 MED ORDER — POLYVINYL ALCOHOL 1.4 % OP SOLN
2.0000 [drp] | OPHTHALMIC | Status: DC | PRN
  Filled 2012-07-18: qty 15

## 2012-07-18 MED ORDER — SENNA 8.6 MG PO TABS
1.0000 | ORAL_TABLET | Freq: Two times a day (BID) | ORAL | Status: DC
  Administered 2012-07-18 – 2012-07-19 (×3): 8.6 mg via ORAL
  Filled 2012-07-18 (×6): qty 1

## 2012-07-18 NOTE — PMR Pre-admission (Signed)
PMR Admission Coordinator Pre-Admission Assessment  Patient: Rodney Riley is an 66 y.o., male MRN: [id number] DOB: 10-Apr-1946 Height: 5\' 5"  (165.1 cm) Weight: 64.6 kg (142 lb 6.7 oz)  Insurance Information HMO: yes      PPO:       PCP:       IPA:     80/20:       OTHER:   PRIMARY: Blue Medicare      Policy#: [id number]      Subscriber: pt CM Name: Eusebio Friendly     Phone#: [phone]     Fax#: [phone] Pre-Cert#: [id number]      Employer: retired Benefits:  Phone #: [phone]     Name: Dion Saucier. Date: 09-14-11     Deduct: 0      Out of Pocket Max: $3400.00 [met $110.94]      Life Max: 0 CIR: $195.00/day days 1-6 then 0      SNF: 100 days max days 1-10 0; $50/day days 11-100 Outpatient:  Co-Pay: $35.00/ visit Home Health: Pre-auth       Co-Pay: 0 DME: 80%     Co-Pay: 20% Providers: [employer] [phone]      Emergency Contact Information Contact Information    Name Relation Home Work Mobile   [place] Spouse [phone]       Current Medical History  Patient Admitting Diagnosis: TBI, thoracolumbar fx's, left scapula fx after fall from ladder  History of Present Illness: 66 y.o. right-handed male with history of coronary artery disease status post CABG and chronic low back pain. Admitted 07/12/2012 after a fall from a ladder while cleaning his gutters when he fell approximately 10-12 feet without loss of consciousness. He complained of left shoulder pain and back pain. Cranial CT scan shows small amount of subarachnoid hemorrhage as well as small right subdural hematoma without hydrocephalus or midline shift. There is also fracture left occipital bone and diastasis of the left lambdoid suture. Patient also sustained multiple thoracic or lumbar fractures at the T11, T12, L1, and L2 levels. Patient also suffered a left scapular fracture as well as multiple left rib fractures. Neurosurgery Dr. Newell Coral consulted and placed in TLSO back brace for multiple thoracic lumbar  fractures and observation of subarachnoid subdural hematoma. Followup orthopedic services Dr. Carola Frost in regards to comminuted left scapular fracture felt to be nonoperative and placed in a shoulder sling and plan gentle range of motion of the shoulder.  Hospital course pain management. Physical and occupational therapy evaluations completed 07/17/2012.       Past Medical History  Past Medical History  Diagnosis Date  . Coronary artery disease   . Hypertension   . Low back pain   . Hyperlipidemia     Family History  family history is not on file.  Prior Rehab/Hospitalizations:     Current Medications  Current facility-administered medications:acetaminophen (TYLENOL) tablet 650 mg, 650 mg, Oral, Q6H PRN, Doristine Mango, PA-C;  albuterol-ipratropium (COMBIVENT) inhaler 2 puff, 2 puff, Inhalation, Q6H, Liz Malady, MD, 2 puff at 07/18/12 1428;  atorvastatin (LIPITOR) tablet 40 mg, 40 mg, Oral, q1800, Freeman Caldron, PA, 40 mg at 07/17/12 1731 bisacodyl (DULCOLAX) EC tablet 5 mg, 5 mg, Oral, Daily, Freeman Caldron, PA, 5 mg at 07/18/12 0160;  diphenhydrAMINE (BENADRYL) 12.5 MG/5ML elixir 12.5 mg, 12.5 mg, Oral, Q6H PRN, Freeman Caldron, PA, 12.5 mg at 07/18/12 0121;  diphenhydrAMINE (BENADRYL) injection 12.5 mg, 12.5 mg, Intravenous, Q6H PRN, Freeman Caldron, PA;  docusate  sodium (COLACE) capsule 200 mg, 200 mg, Oral, BID, Freeman Caldron, PA, 200 mg at 07/18/12 1610 HYDROcodone-acetaminophen (NORCO/VICODIN) 5-325 MG per tablet 0.5-2 tablet, 0.5-2 tablet, Oral, Q4H PRN, Freeman Caldron, PA, 2 tablet at 07/18/12 1421;  influenza  inactive virus vaccine (FLUZONE/FLUARIX) injection 0.5 mL, 0.5 mL, Intramuscular, Prior to discharge, Liz Malady, MD;  lisinopril (PRINIVIL,ZESTRIL) tablet 10 mg, 10 mg, Oral, Daily, Abran Duke, PHARMD, 10 mg at 07/18/12 9604 morphine 2 MG/ML injection 2 mg, 2 mg, Intravenous, Q4H PRN, Freeman Caldron, PA;  multivitamin with minerals  tablet 1 tablet, 1 tablet, Oral, Daily, Freeman Caldron, PA, 1 tablet at 07/18/12 0925;  ondansetron [facility]) injection 4 mg, 4 mg, Intravenous, Q4H PRN, Doristine Mango, PA-C, 4 mg at 07/18/12 1252;  pneumococcal 23 valent vaccine (PNU-IMMUNE) injection 0.5 mL, 0.5 mL, Intramuscular, Prior to discharge, Liz Malady, MD polyethylene glycol (MIRALAX / GLYCOLAX) packet 17 g, 17 g, Oral, Daily, Cherylynn Ridges, MD, 17 g at 07/18/12 5409;  polyvinyl alcohol (LIQUIFILM TEARS) 1.4 % ophthalmic solution 2 drop, 2 drop, Both Eyes, PRN, Liz Malady, MD;  tiZANidine (ZANAFLEX) tablet 4 mg, 4 mg, Oral, Q8H PRN, Liz Malady, MD, 4 mg at 07/14/12 8119;  traMADol (ULTRAM) tablet 50 mg, 50 mg, Oral, Q6H, Liz Malady, MD, 50 mg at 07/18/12 1248 vitamin E capsule 400 Units, 400 Units, Oral, Daily, Freeman Caldron, PA, 400 Units at 07/17/12 1000  Patients Current Diet: General  Precautions / Restrictions Precautions Precautions: Back;Fall;Other (comment) (shoulder) Type of Shoulder Precautions: AROM to comfort Precaution Booklet Issued: Yes (comment) Precaution Comments: Lt shoulder sling for comfort- ROM as patient can tolerate Spinal Brace: Thoracolumbosacral orthotic;Applied in supine position Restrictions Weight Bearing Restrictions:  (no orders) Other Position/Activity Restrictions: limited LT UE weight bearing- need clarification   Prior Activity Level  Independent-active Home Assistive Devices / Equipment Home Assistive Devices/Equipment: None Home Adaptive Equipment: None  Prior Functional Level Prior Function Level of Independence: Independent Able to Take Stairs?: Yes Driving: Yes Vocation: Retired  Current Functional Level Cognition  Arousal/Alertness: Awake/alert Overall Cognitive Status: Appears within functional limits for tasks assessed/performed Orientation Level: Oriented X4    Extremity Assessment (includes Sensation/Coordination)  RUE ROM/Strength/Tone:  Within functional levels  RLE ROM/Strength/Tone: Within functional levels    ADLs  Grooming: Performed;Set up Where Assessed - Grooming: Supported sitting Lower Body Dressing: Performed;+1 Total assistance Where Assessed - Lower Body Dressing: Supine, head of bed up Toilet Transfer: Simulated;Minimal assistance Toilet Transfer: Patient Percentage: 50% (elevated surface) Toilet Transfer Method: Sit to stand Toilet Transfer Equipment: Raised toilet seat with arms (or 3-in-1 over toilet) Equipment Used: Gait belt;Back brace Transfers/Ambulation Related to ADLs: ambulated with min assist with RW, tendency to go left, with reminders to not put excessive weight on L UE, chair following for safety, but not needed ADL Comments: Pt supine on arrival with damp gown on. Gown doff and new gown applied. Pt educated on don brace in supine. Pt wife present to observe. Pt rolling to the Rt better than Lt due to rib fxs. Pt total +2 to don TLSO. pt max (A) supine <> sit with immediate vomitting. Pt with large volume of liquid vomit. Pt with vomitting resolving with prolonged sitting and gaze stabilization. Pt required (A) to sit at EOB and presented with posterior right lean. Pt elevated bed for sit <>Stand. Pt returned to chair after first attempt and agreeable to second ambulation attempt. Pt positioned in chair with grooming items  at end of session. Wife (A)ing patient with oral care.    Mobility  Bed Mobility: Rolling Right;Rolling Left;Right Sidelying to Sit;Sitting - Scoot to Edge of Bed Rolling Right: With rail;4: Min assist Rolling Left: 4: Min assist;With rail Right Sidelying to Sit: 3: Mod assist;HOB flat Sitting - Scoot to Edge of Bed: 2: Max assist    Transfers  Transfers: Sit to Stand;Stand to Sit Sit to Stand: 3: Mod assist;From bed;With upper extremity assist Sit to Stand: Patient Percentage: 50% Stand to Sit: 4: Min assist;To chair/3-in-1;With upper extremity assist Stand to Sit: Patient  Percentage: 50%    Ambulation / Gait / Stairs / Wheelchair Mobility  Ambulation/Gait Ambulation/Gait Assistance: 1: +2 Total assist Ambulation/Gait: Patient Percentage: 60% Ambulation Distance (Feet):  (2'x1, 5'x1) Assistive device: 2 person hand held assist Ambulation/Gait Assistance Details: pt with increased weight-shift to R, minimal Wbing through L UE Gait Pattern: Step-to pattern;Decreased stride length;Decreased stance time - left;Decreased stance time - right Gait velocity: slow General Gait Details: pt had to complete gaze stabilization to assist with dizziness Stairs: No    Posture / Balance Static Sitting Balance Static Sitting - Balance Support: Bilateral upper extremity supported;No upper extremity supported Static Sitting - Level of Assistance: 4: Min assist;5: Stand by assistance;Other (comment) (progressed to stand by, pt encouraged to visually stabilize) Static Sitting - Comment/# of Minutes: 5 Static Standing Balance Static Standing - Balance Support: Right upper extremity supported;Left upper extremity supported;Bilateral upper extremity supported Static Standing - Level of Assistance: 1: +2 Total assist     Previous Home Environment Living Arrangements: Spouse/significant other Lives With: Spouse Available Help at Discharge: Available 24 hours/day;Family Type of Home: House Home Layout: Two level Alternate Level Stairs-Rails: Left;Right Alternate Level Stairs-Number of Steps: 13 Home Access: Stairs to enter Entrance Stairs-Rails: None Entrance Stairs-Number of Steps: 2 Bathroom Shower/Tub: Psychologist, counselling;Door Bathroom Toilet: Standard Home Care Services: No  Discharge Living Setting Plans for Discharge Living Setting: Patient's home Do you have any problems obtaining your medications?: No  Social/Family/Support Systems Patient Roles: Spouse Contact Information: [phone] Anticipated Caregiver: wife, Dewayne Hatch Anticipated Caregiver's Contact Information:  [phone] Ability/Limitations of Caregiver: S-Min A Caregiver Availability: 24/7 Discharge Plan Discussed with Primary Caregiver: Yes Is Caregiver In Agreement with Plan?: Yes Does Caregiver/Family have Issues with Lodging/Transportation while Pt is in Rehab?: No  Goals/Additional Needs Patient/Family Goal for Rehab: Mod I-S Expected length of stay: 7-10 days Pt/Family Agrees to Admission and willing to participate: Yes Program Orientation Provided & Reviewed with Pt/Caregiver Including Roles  & Responsibilities: Yes  Patient Condition: This patient's condition remains as documented in the Consult dated 07/18/12, in which the Rehabilitation Physician determined and documented that the patient's condition is appropriate for intensive rehabilitative care in an inpatient rehabilitation facility.  Preadmission Screen Completed By:  Brock Ra, 07/18/2012 2:43 PM ______________________________________________________________________   Discussed status with Dr. Riley Kill on 07/18/12 at 2:41pm and received telephone approval for admission today.  Admission Coordinator:  Brock Ra, time 2:42 pm/Date 07/18/12

## 2012-07-18 NOTE — Progress Notes (Signed)
Occupational Therapy Treatment Patient Details Name: DRYDEN TAPLEY MRN: [id number] DOB: 12-08-1945 Today's Date: 07/18/2012 Time: 4098-1191 OT Time Calculation (min): 37 min  OT Assessment / Plan / Recommendation Comments on Treatment Session Pt less dizzy and without nausea today.  Used visual stabilization techniques and allowed time between positional changes to decrease dizziness.  Focus of treatment session primarily on mobility as precursor to ADL.      Follow Up Recommendations  Inpatient Rehab    Barriers to Discharge       Equipment Recommendations  3 in 1 bedside comode;Rolling walker with 5" wheels    Recommendations for Other Services Rehab consult  Frequency Min 3X/week   Plan Discharge plan remains appropriate    Precautions / Restrictions Precautions Precautions: Back;Fall;Other (comment) (shoulder) Type of Shoulder Precautions: AROM to comfort Precaution Booklet Issued: Yes (comment) Required Braces or Orthoses: Spinal Brace Spinal Brace: Thoracolumbosacral orthotic;Applied in supine position   Pertinent Vitals/Pain 6/10 back/ribs at start of tx, 2-3/10 upon completion, premedicated    ADL  Grooming: Performed;Set up Where Assessed - Grooming: Supported sitting Toilet Transfer: Mining engineer Method: Sit to stand Equipment Used: Gait belt;Back brace Transfers/Ambulation Related to ADLs: ambulated with min assist with RW, tendency to go left, with reminders to not put excessive weight on L UE, chair following for safety, but not needed    OT Diagnosis:    OT Problem List:   OT Treatment Interventions:     OT Goals ADL Goals Pt Will Perform Grooming: with set-up;Standing at sink ADL Goal: Grooming - Progress: Progressing toward goals Pt Will Perform Upper Body Bathing: with set-up;Supine, head of bed up;Supported;with adaptive equipment Pt Will Perform Upper Body Dressing: with set-up;Supine, head of bed up;Supported;with  adaptive equipment Pt Will Transfer to Toilet: with min assist;3-in-1;Ambulation ADL Goal: Toilet Transfer - Progress: Progressing toward goals Pt Will Perform Toileting - Clothing Manipulation: with min assist;with adaptive equipment;Sitting on 3-in-1 or toilet Pt Will Perform Toileting - Hygiene: with min assist;Sit to stand from 3-in-1/toilet Miscellaneous OT Goals Miscellaneous OT Goal #1: Pt will complete bed mobility mod I as precursor to adls OT Goal: Miscellaneous Goal #1 - Progress: Progressing toward goals Miscellaneous OT Goal #2: pt will don / doff brace mod I with wife (A) PRN as precursor to adls OT Goal: Miscellaneous Goal #2 - Progress: Not met  Visit Information  Last OT Received On: 07/18/12 Assistance Needed: +1 PT/OT Co-Evaluation/Treatment: Yes    Subjective Data      Prior Functioning       Cognition  Overall Cognitive Status: Appears within functional limits for tasks assessed/performed Arousal/Alertness: Awake/alert Orientation Level: Appears intact for tasks assessed Behavior During Session: [facility] for tasks performed    Mobility  Shoulder Instructions Bed Mobility Bed Mobility: Rolling Right;Rolling Left;Right Sidelying to Sit;Sitting - Scoot to [place] of Bed Rolling Right: With rail;4: Min assist Rolling Left: 4: Min assist;With rail Right Sidelying to Sit: 3: Mod assist;HOB flat Sitting - Scoot to Edge of Bed: 2: Max assist Details for Bed Mobility Assistance: verbal cues for log roll technique for donning TLSO and for getting to EOB.  Pt moves slowly due to dizziness. Transfers Transfers: Sit to Stand;Stand to Sit Sit to Stand: 3: Mod assist;From bed;With upper extremity assist Stand to Sit: 4: Min assist;To chair/3-in-1;With upper extremity assist       Exercises      Balance Balance Balance Assessed: Yes Static Sitting Balance Static Sitting - Balance Support: Bilateral upper extremity  supported;No upper extremity supported Static Sitting -  Level of Assistance: 4: Min assist;5: Stand by assistance;Other (comment) (progressed to stand by, pt encouraged to visually stabilize) Static Sitting - Comment/# of Minutes: 5   End of Session OT - End of Session Activity Tolerance: Patient tolerated treatment well Patient left: in chair;with call bell/phone within reach;with family/visitor present  GO     Evern Bio 07/18/2012, 12:00 PM [phone]

## 2012-07-18 NOTE — Progress Notes (Signed)
  Subjective: Thinking more clearly since pain meds adjusted, got up to chair with brace for an hour yesterday  Objective: Vital signs in last 24 hours: Temp:  [97.9 F (36.6 C)-99 F (37.2 C)] 97.9 F (36.6 C) (11/05 0600) Pulse Rate:  [86-97] 86  (11/05 0600) Resp:  [16-18] 16  (11/05 0600) BP: (134-157)/(64-80) 134/64 mmHg (11/05 0600) SpO2:  [93 %-100 %] 97 % (11/05 0753) Last BM Date: 07/12/12  Intake/Output from previous day: 11/04 0701 - 11/05 0700 In: -  Out: 350 [Urine:350] Intake/Output this shift:    General appearance: alert and cooperative Resp: clear to auscultation bilaterally Cardio: regular rate and rhythm GI: soft, Mild distention, active BS Neuro: MAE, F/C  Lab Results: CBC  No results found for this basename: WBC:2,HGB:2,HCT:2,PLT:2 in the last 72 hours BMET No results found for this basename: NA:2,K:2,CL:2,CO2:2,GLUCOSE:2,BUN:2,CREATININE:2,CALCIUM:2 in the last 72 hours PT/INR No results found for this basename: LABPROT:2,INR:2 in the last 72 hours ABG No results found for this basename: PHART:2,PCO2:2,PO2:2,HCO3:2 in the last 72 hours  Studies/Results: Ct Head Wo Contrast  07/17/2012  *RADIOLOGY REPORT*  Clinical Data: Intracranial hemorrhage.  CT HEAD WITHOUT CONTRAST  Technique:  Contiguous axial images were obtained from the base of the skull through the vertex without contrast.  Comparison: 07/13/2012  Findings: Subtle areas of subarachnoid hemorrhage in the posterior temporal and posterior parietal regions bilaterally slightly more prominent.  The tiny right temporal subdural hemorrhage is less apparent.  Edema in the posterior parietal lobes bilaterally as indicated by less prominent cortical sulci than on the study of 07/12/2012.  Probable hairline fracture of the left side of the occipital bone is again noted.  IMPRESSION: Slightly increased subarachnoid hemorrhage.  Increased edema in the posterior parietal lobes without midline shift.    Original Report Authenticated By: Francene Boyers, M.D.    Dg Chest Port 1 View  07/17/2012  *RADIOLOGY REPORT*  Clinical Data: Follow up multiple rib fractures  PORTABLE CHEST - 1 VIEW  Comparison: Chest x-ray 07/13/2012  Findings: No pneumothorax.  Small left pleural effusion and left lower lobe atelectasis.  The right diaphragm is elevated. Unchanged cardiomegaly.  Median sternotomy with evidence of prior multivessel CABG including LIMA bypass.  Healing mildly displaced fractures of the posterior aspects of left ribs three through six. Nondisplaced fractures of the posterior aspects of the lower left ribs better demonstrated on prior CT.  IMPRESSION: 1.  Multiple healing left-sided rib fractures. No pneumothorax. 2.  Left basilar opacity and small left pleural effusion.  The opacity likely represents atelectasis.  Infection is difficult to exclude in the appropriate clinical setting. 3.  Mild cardiomegaly, unchanged.   Original Report Authenticated By: Malachy Moan, M.D.     Anti-infectives: Anti-infectives    None      Assessment/Plan: Fall Multiple L rib Fxs L scapula Fx - non-op per Dr. Carola Frost T11, T12, L1, L2 Fx - check upright T and L spine x rays today in brace Small R SAH and SDH - neuro exam stable FEN - no BM, on Miralax and Colace VTE - PAS (SDH) Dispo - possible CIR   LOS: 6 days    Rodney Gelinas, MD, MPH, FACS Pager: [phone]  07/18/2012

## 2012-07-18 NOTE — Progress Notes (Signed)
Physical Therapy Treatment Note   07/18/12 1007  PT Visit Information  Last PT Received On 07/18/12  Assistance Needed +1  PT Time Calculation  PT Start Time 1007  PT Stop Time 1045  PT Time Calculation (min) 38 min  Subjective Data  Subjective Pt received supine in bed agreeable to PT. c/o 2/10 back/rib pain. no dizziness  Precautions  Precautions Back;Shoulder  Type of Shoulder Precautions AROM to comfort  Precaution Booklet Issued Yes (comment)  Precaution Comments Lt shoulder sling for comfort- ROM as patient can tolerate  Required Braces or Orthoses Spinal Brace  Spinal Brace TLSO;Applied in supine position  Restrictions  Weight Bearing Restrictions Yes  Cognition  Overall Cognitive Status Appears within functional limits for tasks assessed/performed  Arousal/Alertness Awake/alert  Orientation Level Appears intact for tasks assessed  Behavior During Session [facility] for tasks performed  Bed Mobility  Bed Mobility Rolling Left;Rolling Right  Rolling Right 4: Min assist;With rail  Rolling Left 3: Mod assist (due to pain)  Right Sidelying to Sit 3: Mod assist;HOB flat;With rails (assist at trunk)  Sitting - Scoot to Edge of Bed 3: Mod assist  Details for Bed Mobility Assistance pt requires increased time due to pain, directional verbal cues  Transfers  Transfers Sit to Stand;Stand to Sit  Sit to Stand With upper extremity assist;From elevated surface;From bed;3: Mod assist  Stand to Sit 4: Min assist  Details for Transfer Assistance increased time  Ambulation/Gait  Ambulation/Gait Assistance 3: Mod assist  Ambulation Distance (Feet) 120 Feet  Assistive device Rolling walker  Ambulation/Gait Assistance Details assist to mange RW due to + vearing to L due to limited L UE wbing  Gait Pattern Step-through pattern;Decreased stride length  Gait velocity slow, however improved t/o ambulation  General Gait Details pt transitioned to more fluid gait pattern with relaxed shlds    Stairs No  Static Sitting Balance  Static Sitting - Balance Support Right upper extremity supported;Feet supported  Static Sitting - Level of Assistance 4: Min assist  Static Sitting - Comment/# of Minutes 5  PT - End of Session  Equipment Utilized During Treatment Gait belt;Back brace  Activity Tolerance Patient tolerated treatment well  Patient left in chair;with call bell/phone within reach;with family/visitor present  Nurse Communication Mobility status  PT - Assessment/Plan  Comments on Treatment Session Pt demonstrated significant progress this date. Patient con't to be dependent for don/doffing back brace. Patient with considerable back/rib pain requiring increased assist for transfers however has significantly improved ambulation tolerance/distance. Pt con't to benefit from CIR to regain I function for safe transition home.  PT Plan Discharge plan remains appropriate  PT Frequency Min 5X/week  Follow Up Recommendations CIR  Acute Rehab PT Goals  PT Goal: Rolling Supine to Right Side - Progress Progressing toward goal  PT Goal: Supine/Side to Sit - Progress Progressing toward goal  PT Goal: Sit to Supine/Side - Progress Progressing toward goal  PT Goal: Sit to Stand - Progress Progressing toward goal  PT Goal: Ambulate - Progress Progressing toward goal  PT General Charges  $$ ACUTE PT VISIT 1 Procedure  PT Treatments  $Gait Training 8-22 mins  $Therapeutic Activity 23-37 mins    Pain: 8/10 rib pain during mobility, improved back pain during ambulation Lewis Shock, PT, DPT Pager #: [phone] Office #: [phone]

## 2012-07-18 NOTE — Consult Note (Signed)
Physical Medicine and Rehabilitation Consult Reason for Consult: Fall with multitrauma Referring Physician: Trauma services   HPI: Rodney Riley is a 66 y.o. right-handed male with history of coronary artery disease status post CABG and chronic low back pain. Admitted 07/12/2012 after a fall from a ladder while cleaning his gutters when he fell approximately 10-12 feet without loss of consciousness. He complained of left shoulder pain and back pain. Cranial CT scan shows small amount of subarachnoid hemorrhage as well as small right subdural hematoma without hydrocephalus or midline shift. There is also fracture left occipital bone and diastasis of the left lambdoid suture. Patient also sustained multiple thoracic or lumbar fractures at the T11, T12, L1, and L2 levels. Patient also suffered a left scapular fracture as well as multiple left rib fractures. Neurosurgery Dr. Newell Coral consulted and placed in TLSO back brace for multiple thoracic lumbar fractures and observation of subarachnoid subdural hematoma. Followup orthopedic services Dr. Carola Frost in regards to comminuted left scapular fracture felt to be nonoperative and placed in a shoulder sling and plan gentle range of motion of the shoulder. Hospital course pain management. Physical and occupational therapy evaluations completed 07/17/2012 ongoing with recommendations of physical medicine rehabilitation consult to consider inpatient rehabilitation services  Review of Systems  Gastrointestinal: Positive for constipation.  Musculoskeletal: Positive for back pain.  All other systems reviewed and are negative.   Past Medical History  Diagnosis Date  . Coronary artery disease   . Hypertension   . Low back pain   . Hyperlipidemia    Past Surgical History  Procedure Date  . Cardiac surgery   . Lumbar disc surgery    History reviewed. No pertinent family history. Social History:  reports that he has never smoked. He does not have any  smokeless tobacco history on file. His alcohol and drug histories not on file. Allergies: No Known Allergies Medications Prior to Admission  Medication Sig Dispense Refill  . aspirin 325 MG tablet Take 325 mg by mouth daily.      . Multiple Vitamin (MULTIVITAMIN WITH MINERALS) TABS Take 1 tablet by mouth daily.      . quinapril (ACCUPRIL) 20 MG tablet Take 10 mg by mouth daily.      . simvastatin (ZOCOR) 80 MG tablet Take 80 mg by mouth at bedtime.      . vitamin E 400 UNIT capsule Take 400 Units by mouth daily.        Home: Home Living Lives With: Spouse Available Help at Discharge: Available 24 hours/day;Family Type of Home: House Home Access: Stairs to enter [employer] of Steps: 2 Entrance Stairs-Rails: None Home Layout: Two level Alternate Level Stairs-Number of Steps: 13 Alternate Level Stairs-Rails: Left;Right Bathroom Shower/Tub: Walk-in shower;Door Dentist: None  Functional History: Prior Function Able to Take Stairs?: Yes Driving: Yes Vocation: Retired Functional Status:  Mobility: Bed Mobility Bed Mobility: Not assessed (pt received sitting EOB) Rolling Right: 4: Min assist;With rail Right Sidelying to Sit: 2: Max assist;HOB flat Sitting - Scoot to [place] of Bed: 3: Mod assist Transfers Transfers: Sit to Stand;Stand to Sit Sit to Stand: 1: +2 Total assist;With upper extremity assist;From bed Sit to Stand: Patient Percentage: 50% Stand to Sit: 1: +2 Total assist;With upper extremity assist;To elevated surface;To chair/3-in-1 Stand to Sit: Patient Percentage: 50% Ambulation/Gait Ambulation/Gait Assistance: 1: +2 Total assist Ambulation/Gait: Patient Percentage: 60% Ambulation Distance (Feet):  (2'x1, 5'x1) Assistive device: 2 person hand held assist Ambulation/Gait Assistance Details: pt with  increased weight-shift to R, minimal Wbing through L UE Gait Pattern: Step-to pattern;Decreased stride  length;Decreased stance time - left;Decreased stance time - right Gait velocity: slow General Gait Details: pt had to complete gaze stabilization to assist with dizziness Stairs: No    ADL: ADL Grooming: Performed;Set up Where Assessed - Grooming: Supported sitting Lower Body Dressing: Performed;+1 Total assistance Where Assessed - Lower Body Dressing: Supine, head of bed up Toilet Transfer: Simulated;+2 Total assistance Toilet Transfer Method: Sit to stand Toilet Transfer Equipment: Raised toilet seat with arms (or 3-in-1 over toilet) Equipment Used: Gait belt;Back brace Transfers/Ambulation Related to ADLs: pt ambulated with posterior lean and rt lean. pt required max v/c for posterior and facilitation to shift weight. pt reports Rt LE stronger than Lt LE at this time. ADL Comments: Pt supine on arrival with damp gown on. Gown doff and new gown applied. Pt educated on don brace in supine. Pt wife present to observe. Pt rolling to the Rt better than Lt due to rib fxs. Pt total +2 to don TLSO. pt max (A) supine <> sit with immediate vomitting. Pt with large volume of liquid vomit. Pt with vomitting resolving with prolonged sitting and gaze stabilization. Pt required (A) to sit at EOB and presented with posterior right lean. Pt elevated bed for sit <>Stand. Pt returned to chair after first attempt and agreeable to second ambulation attempt. Pt positioned in chair with grooming items at end of session. Wife (A)ing patient with oral care.  Cognition: Cognition Arousal/Alertness: Awake/alert Orientation Level: Oriented X4 Cognition Overall Cognitive Status: Appears within functional limits for tasks assessed/performed Arousal/Alertness: Awake/alert Orientation Level: Appears intact for tasks assessed Behavior During Session: [facility] for tasks performed  Blood pressure 149/67, pulse 95, temperature 99 F (37.2 C), temperature source Oral, resp. rate 18, height 5\' 5"  (1.651 m), weight 64.6 kg  (142 lb 6.7 oz), SpO2 93.00%. Physical Exam  Vitals reviewed. Constitutional: He is oriented to person, place, and time. He appears well-developed.  HENT:  Head: Normocephalic.  Eyes:       Pupils round and reactive to light  Neck: Neck supple. No thyromegaly present.  Cardiovascular: Normal rate and regular rhythm.   Pulmonary/Chest: Effort normal and breath sounds normal. He has no wheezes.  Abdominal: Soft. Bowel sounds are normal. There is no tenderness.  Musculoskeletal: He exhibits no edema.  Neurological: He is alert and oriented to person, place, and time. No cranial nerve deficit.       Follows three-step commands. Patient can recall full of events of his fall. Mild sensory loss over the lateral leg and foot. Strength grossly 3-4/5 with pain inhibition proximally.   Skin: Skin is warm and dry.  Psychiatric: He has a normal mood and affect. His behavior is normal. Judgment and thought content normal.    No results found for this or any previous visit (from the past 24 hour(s)). Ct Head Wo Contrast  07/17/2012  *RADIOLOGY REPORT*  Clinical Data: Intracranial hemorrhage.  CT HEAD WITHOUT CONTRAST  Technique:  Contiguous axial images were obtained from the base of the skull through the vertex without contrast.  Comparison: 07/13/2012  Findings: Subtle areas of subarachnoid hemorrhage in the posterior temporal and posterior parietal regions bilaterally slightly more prominent.  The tiny right temporal subdural hemorrhage is less apparent.  Edema in the posterior parietal lobes bilaterally as indicated by less prominent cortical sulci than on the study of 07/12/2012.  Probable hairline fracture of the left side of the occipital bone  is again noted.  IMPRESSION: Slightly increased subarachnoid hemorrhage.  Increased edema in the posterior parietal lobes without midline shift.   Original Report Authenticated By: Francene Boyers, M.D.    Dg Chest Port 1 View  07/17/2012  *RADIOLOGY REPORT*   Clinical Data: Follow up multiple rib fractures  PORTABLE CHEST - 1 VIEW  Comparison: Chest x-ray 07/13/2012  Findings: No pneumothorax.  Small left pleural effusion and left lower lobe atelectasis.  The right diaphragm is elevated. Unchanged cardiomegaly.  Median sternotomy with evidence of prior multivessel CABG including LIMA bypass.  Healing mildly displaced fractures of the posterior aspects of left ribs three through six. Nondisplaced fractures of the posterior aspects of the lower left ribs better demonstrated on prior CT.  IMPRESSION: 1.  Multiple healing left-sided rib fractures. No pneumothorax. 2.  Left basilar opacity and small left pleural effusion.  The opacity likely represents atelectasis.  Infection is difficult to exclude in the appropriate clinical setting. 3.  Mild cardiomegaly, unchanged.   Original Report Authenticated By: Malachy Moan, M.D.     Assessment/Plan: Diagnosis: TBI, thoracolumbar fx's, left scapula fx after fall from ladder 1. Does the need for close, 24 hr/day medical supervision in concert with the patient's rehab needs make it unreasonable for this patient to be served in a less intensive setting? Yes 2. Co-Morbidities requiring supervision/potential complications: pain 3. Due to bladder management, bowel management, safety, skin/wound care, disease management, medication administration, pain management and patient education, does the patient require 24 hr/day rehab nursing? Yes 4. Does the patient require coordinated care of a physician, rehab nurse, PT (1-2 hrs/day, 5 days/week), OT (1-2 hrs/day, 5 days/week) and SLP (1-2 hrs/day, 5 days/week) to address physical and functional deficits in the context of the above medical diagnosis(es)? Yes Addressing deficits in the following areas: balance, endurance, locomotion, strength, transferring, bowel/bladder control, bathing, dressing, feeding, grooming, toileting, cognition and psychosocial support 5. Can the patient  actively participate in an intensive therapy program of at least 3 hrs of therapy per day at least 5 days per week? Yes 6. The potential for patient to make measurable gains while on inpatient rehab is excellent 7. Anticipated functional outcomes upon discharge from inpatient rehab are mod I to supervision with PT,  Mod I to supervision with OT,  Mod I with SLP. 8. Estimated rehab length of stay to reach the above functional goals is: 7-10 days 9. Does the patient have adequate social supports to accommodate these discharge functional goals? Yes 10. Anticipated D/C setting: Home 11. Anticipated post D/C treatments: Outpt therapy 12. Overall Rehab/Functional Prognosis: excellent  RECOMMENDATIONS: This patient's condition is appropriate for continued rehabilitative care in the following setting: CIR Patient has agreed to participate in recommended program. Yes Note that insurance prior authorization may be required for reimbursement for recommended care.  Comment:Rehab RN to follow up.   Ivory Broad, MD     07/18/2012

## 2012-07-18 NOTE — Clinical Social Work Psychosocial (Signed)
     Clinical Social Work Department BRIEF PSYCHOSOCIAL ASSESSMENT 07/18/2012  Patient:  Rodney Riley, Rodney Riley     Account Number:  [id number]     Admit date:  07/12/2012  Clinical Social Worker:  Pearson Forster  Date/Time:  07/18/2012 03:25 PM  Referred by:  Physician  Date Referred:  07/18/2012 Referred for  Psychosocial assessment   Other Referral:   Interview type:  Patient Other interview type:   Patient wife at bedside and able to assist with conversation    PSYCHOSOCIAL DATA Living Status:  WIFE Admitted from facility:   Level of care:   Primary support name:  Rodney Riley, Rodney Riley  [phone] Primary support relationship to patient:  SPOUSE Degree of support available:   Strong    CURRENT CONCERNS Current Concerns  None Noted   Other Concerns:    SOCIAL WORK ASSESSMENT / PLAN Clinical Social Worker met with patient and patient wife at bedside to offer support and discuss patient needs at discharge.  Patient states that he fell from a ladder at home.  Patient plans to go to inpatient rehab today and has already been approved by his insurance provider and awaiting transfer.  Patient plans to return home from rehab with his wife once medically stable.    Clinical Social Worker inquired about current drug and alcohol use.  Patient and patient wife states that they drink socially with friends but are aware of the risks associated with alcohol and medications.  Patient did not express concerns regarding current use.  SBIRT complete. No resources provided at this time.    Clinical Social Worker signing off at this time due to patient plans to transfer to inpatient rehab today.   Assessment/plan status:  No Further Intervention Required Other assessment/ plan:   Information/referral to community resources:   None provided at this time per patient and family request    PATIENTS/FAMILYS RESPONSE TO PLAN OF CARE: Patient alert and oriented x3 laying in the bed exhausted. Patient  has been up with therapies all day and was ready to get rest after receiving pain medication.  Patient wife is very supportive and ready to begin patient rehab process. Patient and wife expressed their appreciation for CSW concern and support.

## 2012-07-18 NOTE — Progress Notes (Signed)
Patient refused 2am vitals and didn't want to be disturbed after receiving benadryl until [facility]

## 2012-07-18 NOTE — Progress Notes (Signed)
Pt admitted to room 4007, wife at bedside, Pt and wife oriented to rehab unit, safety plan,and room, Pt and wife verbalized an understanding, pt resting in bed with call bell in reach

## 2012-07-18 NOTE — Progress Notes (Signed)
Orthopaedic Trauma Service (OTS)  Subjective: Ambulated today; CT scan shoulder and plain films of back completed also Denies change in LEX symptoms   Objective: Current Vitals Blood pressure 134/64, pulse 86, temperature 97.9 F (36.6 C), temperature source Oral, resp. rate 16, height 5\' 5"  (1.651 m), weight 142 lb 6.7 oz (64.6 kg), SpO2 97.00%. Vital signs in last 24 hours: Temp:  [97.9 F (36.6 C)-99 F (37.2 C)] 97.9 F (36.6 C) (11/05 0600) Pulse Rate:  [86-95] 86  (11/05 0600) Resp:  [16-18] 16  (11/05 0600) BP: (134-155)/(64-80) 134/64 mmHg (11/05 0600) SpO2:  [93 %-97 %] 97 % (11/05 0753)  Intake/Output from previous day: 11/04 0701 - 11/05 0700 In: -  Out: 350 [Urine:350]  LABS No results found for this basename: HGB:5 in the last 72 hours No results found for this basename: WBC:2,RBC:2,HCT:2,PLT:2 in the last 72 hours No results found for this basename: NA:2,K:2,CL:2,CO2:2,BUN:2,CREATININE:2,GLUCOSE:2,CALCIUM:2 in the last 72 hours No results found for this basename: LABPT:2,INR:2 in the last 72 hours  Resting supine an intermittently sleeping.   Imaging Ct Head Wo Contrast  07/17/2012  *RADIOLOGY REPORT*  Clinical Data: Intracranial hemorrhage.  CT HEAD WITHOUT CONTRAST  Technique:  Contiguous axial images were obtained from the base of the skull through the vertex without contrast.  Comparison: 07/13/2012  Findings: Subtle areas of subarachnoid hemorrhage in the posterior temporal and posterior parietal regions bilaterally slightly more prominent.  The tiny right temporal subdural hemorrhage is less apparent.  Edema in the posterior parietal lobes bilaterally as indicated by less prominent cortical sulci than on the study of 07/12/2012.  Probable hairline fracture of the left side of the occipital bone is again noted.  IMPRESSION: Slightly increased subarachnoid hemorrhage.  Increased edema in the posterior parietal lobes without midline shift.   Original Report  Authenticated By: Francene Boyers, M.D.    Ct Shoulder Left Wo Contrast  07/18/2012  **ADDENDUM** CREATED: 07/18/2012 13:56:24  *RADIOLOGY REPORT*  3-DIMENSIONAL CT IMAGE RENDERING AT INDEPENDENT WORKSTATION:  Technique:  3-dimensional CT images were rendered by post- processing of the original CT data at independent workstation.  The 3-dimensional CT images were interpreted, and findings are reported in the accompanying complete CT report for this study.  3-D images were created and better demonstrate the relationship of the multiple scapular fracture fragments one another including the comminuted fracture of the superior aspect of the glenoid.  **END ADDENDUM** SIGNED BY: Allayne Gitelman. Jena Gauss, M.D.   07/18/2012  *RADIOLOGY REPORT*  Clinical Data: Left scapular fracture.  Multiple rib fractures.  CT OF THE LEFT SHOULDER WITHOUT CONTRAST  Technique:  Multidetector CT imaging was performed according to the standard protocol. Multiplanar CT image reconstructions were also generated.  Comparison: Radiographs dated 07/12/2012  Findings: There is a comminuted slightly impacted fracture of the undersurface of the distal clavicle.  The distal clavicle is also deformed secondary to a prior well healed fracture.  There is a fracture of the base of the acromion with 7 mm of distraction.  There is also a fracture through the base of the coracoid with 6 mm of distraction.  This fracture extends into the superior aspect of the glenoid.  The articular surface of the glenoid is fragmented and slightly impacted.  Linear fractures extend along the superior aspect of the scapula to the medial border.  Note is made of fractures of the left second through 6th ribs and left eighth rib.  There is a left pleural effusion with compressive atelectasis in the left  lower lobe.  The proximal humerus is intact.  No dislocation.  IMPRESSION:  1.  Comminuted fracture of the scapula involving the bases of the coracoid and acromial processes as well  as the superior aspect of the glenoid extending into the body of the scapula. 2.  Fracture of the distal clavicle. 3.  Multiple left rib fractures.   Original Report Authenticated By: Francene Boyers, M.D.    Dg Chest Port 1 View  07/17/2012  *RADIOLOGY REPORT*  Clinical Data: Follow up multiple rib fractures  PORTABLE CHEST - 1 VIEW  Comparison: Chest x-ray 07/13/2012  Findings: No pneumothorax.  Small left pleural effusion and left lower lobe atelectasis.  The right diaphragm is elevated. Unchanged cardiomegaly.  Median sternotomy with evidence of prior multivessel CABG including LIMA bypass.  Healing mildly displaced fractures of the posterior aspects of left ribs three through six. Nondisplaced fractures of the posterior aspects of the lower left ribs better demonstrated on prior CT.  IMPRESSION: 1.  Multiple healing left-sided rib fractures. No pneumothorax. 2.  Left basilar opacity and small left pleural effusion.  The opacity likely represents atelectasis.  Infection is difficult to exclude in the appropriate clinical setting. 3.  Mild cardiomegaly, unchanged.   Original Report Authenticated By: Malachy Moan, M.D.    Assess: displaced left glenoid frx with nearly 3mm of step off below superior labrum, minimal gapping, no subluxation PLAN: I reviewed the CT in detail with his wife and communicated the findings to the patient, as well.  I continue to recommend nonoperative management as he is likely to have acceptable function without instability and with low expectation for significant complications.  Reducing the fracture segments to produce an anatomic result increases the risk of complications as this likely could not be done without appreciable dissection and mobilization, but it is feasible. We will obtain a 3D recon with the proximal humerus subtracted out to review with the family and send to his daughter, an Naval architect in [place].  Myrene Galas, MD [facility]  PC [phone] (p)

## 2012-07-18 NOTE — Progress Notes (Signed)
Filed Vitals:   07/17/12 1839 07/17/12 2217 07/18/12 0600 07/18/12 0753  BP: 153/78 149/67 134/64   Pulse: 90 95 86   Temp: 98.2 F (36.8 C) 99 F (37.2 C) 97.9 F (36.6 C)   TempSrc: Oral Oral Oral   Resp: 18 18 16    Height:      Weight:      SpO2: 96% 93% 95% 97%    Patient resting comfortably in bed. Mobilization begun yesterday. Patient does describe some confusion and racing thoughts, cause maybe multifactorial including effects of hospitalization and ICU stay, medications, and his mild head injury.  On exam he is awake and alert, oriented. Following commands. Moving all 4 extremity is well.  Plan: Continuing PT and OT. Seen by PM and R regarding CIR, certainly would be appropriate candidate.  Hewitt Shorts, MD 07/18/2012, 8:18 AM

## 2012-07-18 NOTE — Progress Notes (Signed)
RT Note: Spoke with pt and wife about BIPAP QHS. Pt states he does not wea a bipap/cpap at home and has not had a sleep study done or heard anything about him having OSA. Pt on RA at this time tolerating well BBS clear. RT will continue to monitor

## 2012-07-18 NOTE — H&P (View-Only) (Signed)
Physical Medicine and Rehabilitation Admission H&P    Chief Complaint  Patient presents with  . Fall  : HPI: Rodney Riley is a 66 y.o. right-handed male with history of coronary artery disease status post CABG and chronic low back pain. Admitted 07/12/2012 after a fall from a ladder while cleaning his gutters when he fell approximately 10-12 feet without loss of consciousness. He complained of left shoulder pain and back pain. Cranial CT scan shows small amount of subarachnoid hemorrhage as well as small right subdural hematoma without hydrocephalus or midline shift. There is also fracture left occipital bone and diastasis of the left lambdoid suture. Patient also sustained multiple thoracic and lumbar fractures at the T11, T12, L1, and L2 levels. Patient also suffered a left scapular fracture as well as multiple left rib fractures. Neurosurgery Dr. Nudelman consulted and placed in TLSO back brace for multiple thoracic lumbar fractures with planned followup lumbar thoracic spine films today and observation of subarachnoid subdural hematoma. Followup orthopedic services Dr. Handy in regards to comminuted left scapular fracture felt to be nonoperative and placed in a shoulder sling and plan gentle range of motion of the shoulder. Plan CT of left shoulder 07/18/2012 for followup of scapular fracture reviewed by orthopedic services and continues to recommend non-operative management. Hospital course pain management. Physical and occupational therapy evaluations completed 07/17/2012 ongoing with recommendations of physical medicine rehabilitation consult to consider inpatient rehabilitation services. Patient was felt to be a good candidate for inpatient rehabilitation services and was admitted for comprehensive rehabilitation program  Review of Systems  Gastrointestinal: Positive for constipation.  Musculoskeletal: Positive for back pain.  All other systems reviewed and are negative   Past Medical  History  Diagnosis Date  . Coronary artery disease   . Hypertension   . Low back pain   . Hyperlipidemia    Past Surgical History  Procedure Date  . Cardiac surgery   . Lumbar disc surgery    History reviewed. No pertinent family history. Social History:  reports that he has never smoked. He does not have any smokeless tobacco history on file. His alcohol and drug histories not on file. Allergies: No Known Allergies Medications Prior to Admission  Medication Sig Dispense Refill  . aspirin 325 MG tablet Take 325 mg by mouth daily.      . Multiple Vitamin (MULTIVITAMIN WITH MINERALS) TABS Take 1 tablet by mouth daily.      . quinapril (ACCUPRIL) 20 MG tablet Take 10 mg by mouth daily.      . simvastatin (ZOCOR) 80 MG tablet Take 80 mg by mouth at bedtime.      . vitamin E 400 UNIT capsule Take 400 Units by mouth daily.        Home: Home Living Lives With: Spouse Available Help at Discharge: Available 24 hours/day;Family Type of Home: House Home Access: Stairs to enter Entrance Stairs-Number of Steps: 2 Entrance Stairs-Rails: None Home Layout: Two level Alternate Level Stairs-Number of Steps: 13 Alternate Level Stairs-Rails: Left;Right Bathroom Shower/Tub: Walk-in shower;Door Bathroom Toilet: Standard Home Adaptive Equipment: None   Functional History: Prior Function Able to Take Stairs?: Yes Driving: Yes Vocation: Retired  Functional Status:  Mobility: Bed Mobility Bed Mobility: Not assessed (pt received sitting EOB) Rolling Right: 4: Min assist;With rail Right Sidelying to Sit: 2: Max assist;HOB flat Sitting - Scoot to Edge of Bed: 3: Mod assist Transfers Transfers: Sit to Stand;Stand to Sit Sit to Stand: 1: +2 Total assist;With upper extremity assist;From bed Sit   to Stand: Patient Percentage: 50% Stand to Sit: 1: +2 Total assist;With upper extremity assist;To elevated surface;To chair/3-in-1 Stand to Sit: Patient Percentage:  50% Ambulation/Gait Ambulation/Gait Assistance: 1: +2 Total assist Ambulation/Gait: Patient Percentage: 60% Ambulation Distance (Feet):  (2'x1, 5'x1) Assistive device: 2 person hand held assist Ambulation/Gait Assistance Details: pt with increased weight-shift to R, minimal Wbing through L UE Gait Pattern: Step-to pattern;Decreased stride length;Decreased stance time - left;Decreased stance time - right Gait velocity: slow General Gait Details: pt had to complete gaze stabilization to assist with dizziness Stairs: No    ADL: ADL Grooming: Performed;Set up Where Assessed - Grooming: Supported sitting Lower Body Dressing: Performed;+1 Total assistance Where Assessed - Lower Body Dressing: Supine, head of bed up Toilet Transfer: Simulated;+2 Total assistance Toilet Transfer Method: Sit to stand Toilet Transfer Equipment: Raised toilet seat with arms (or 3-in-1 over toilet) Equipment Used: Gait belt;Back brace Transfers/Ambulation Related to ADLs: pt ambulated with posterior lean and rt lean. pt required max v/c for posterior and facilitation to shift weight. pt reports Rt LE stronger than Lt LE at this time. ADL Comments: Pt supine on arrival with damp gown on. Gown doff and new gown applied. Pt educated on don brace in supine. Pt wife present to observe. Pt rolling to the Rt better than Lt due to rib fxs. Pt total +2 to don TLSO. pt max (A) supine <> sit with immediate vomitting. Pt with large volume of liquid vomit. Pt with vomitting resolving with prolonged sitting and gaze stabilization. Pt required (A) to sit at EOB and presented with posterior right lean. Pt elevated bed for sit <>Stand. Pt returned to chair after first attempt and agreeable to second ambulation attempt. Pt positioned in chair with grooming items at end of session. Wife (A)ing patient with oral care.  Cognition: Cognition Arousal/Alertness: Awake/alert Orientation Level: Oriented X4 Cognition Overall Cognitive  Status: Appears within functional limits for tasks assessed/performed Arousal/Alertness: Awake/alert Orientation Level: Appears intact for tasks assessed Behavior During Session: WFL for tasks performed   Blood pressure 134/64, pulse 86, temperature 97.9 F (36.6 C), temperature source Oral, resp. rate 16, height 5' 5" (1.651 m), weight 64.6 kg (142 lb 6.7 oz), SpO2 97.00%. Physical Exam  Vitals reviewed.  Constitutional: He is oriented to person, place, and time. He appears well-developed.  HENT:  Head: Normocephalic.  Eyes:  Pupils round and reactive to light  Neck: Neck supple. No thyromegaly present.  Cardiovascular: Normal rate and regular rhythm. No murmurs Pulmonary/Chest: Effort normal and breath sounds normal. He has no wheezes. No rales Abdominal: Soft. Bowel sounds are normal. There is no tenderness.  Musculoskeletal: He exhibits no edema. Tender at left shoulder, mid-low back. Scattered bruising Neurological: He is alert and oriented to person, place, and time. No cranial nerve deficit.  Follows three-step commands. Patient can recall full of events of his fall. Mild sensory loss over the lateral left leg and foot. Strength generally 3-4/5 with pain inhibition proximally, especially in the LUE (2-3/5).  Skin: Skin is warm and dry.  Psychiatric: He has a normal mood and affect. His behavior is normal. Judgment and thought content normal   No results found for this or any previous visit (from the past 48 hour(s)). Ct Head Wo Contrast  07/17/2012  *RADIOLOGY REPORT*  Clinical Data: Intracranial hemorrhage.  CT HEAD WITHOUT CONTRAST  Technique:  Contiguous axial images were obtained from the base of the skull through the vertex without contrast.  Comparison: 07/13/2012  Findings: Subtle areas of   subarachnoid hemorrhage in the posterior temporal and posterior parietal regions bilaterally slightly more prominent.  The tiny right temporal subdural hemorrhage is less apparent.  Edema  in the posterior parietal lobes bilaterally as indicated by less prominent cortical sulci than on the study of 07/12/2012.  Probable hairline fracture of the left side of the occipital bone is again noted.  IMPRESSION: Slightly increased subarachnoid hemorrhage.  Increased edema in the posterior parietal lobes without midline shift.   Original Report Authenticated By: James Maxwell, M.D.    Dg Chest Port 1 View  07/17/2012  *RADIOLOGY REPORT*  Clinical Data: Follow up multiple rib fractures  PORTABLE CHEST - 1 VIEW  Comparison: Chest x-ray 07/13/2012  Findings: No pneumothorax.  Small left pleural effusion and left lower lobe atelectasis.  The right diaphragm is elevated. Unchanged cardiomegaly.  Median sternotomy with evidence of prior multivessel CABG including LIMA bypass.  Healing mildly displaced fractures of the posterior aspects of left ribs three through six. Nondisplaced fractures of the posterior aspects of the lower left ribs better demonstrated on prior CT.  IMPRESSION: 1.  Multiple healing left-sided rib fractures. No pneumothorax. 2.  Left basilar opacity and small left pleural effusion.  The opacity likely represents atelectasis.  Infection is difficult to exclude in the appropriate clinical setting. 3.  Mild cardiomegaly, unchanged.   Original Report Authenticated By: Heath McCullough, M.D.     Post Admission Physician Evaluation: 1. Functional deficits secondary  to TBI, thoraco-lumbar fx's, left scapula fx. 2. Patient is admitted to receive collaborative, interdisciplinary care between the physiatrist, rehab nursing staff, and therapy team. 3. Patient's level of medical complexity and substantial therapy needs in context of that medical necessity cannot be provided at a lesser intensity of care such as a SNF. 4. Patient has experienced substantial functional loss from his/her baseline which was documented above under the "Functional History" and "Functional Status" headings.  Judging by  the patient's diagnosis, physical exam, and functional history, the patient has potential for functional progress which will result in measurable gains while on inpatient rehab.  These gains will be of substantial and practical use upon discharge  in facilitating mobility and self-care at the household level. 5. Physiatrist will provide 24 hour management of medical needs as well as oversight of the therapy plan/treatment and provide guidance as appropriate regarding the interaction of the two. 6. 24 hour rehab nursing will assist with bladder management, bowel management, safety, skin/wound care, disease management, medication administration, pain management and patient education  and help integrate therapy concepts, techniques,education, etc. 7. PT will assess and treat for:  fxnl mobility, pain mgt, balance, NMR, adaptive equipment and techniques, family and pt ed.  Goals are: supervision to min assist. 8. OT will assess and treat for: UES, fxnl mobilty, ADL's, safety, NMR, adaptive equipment .   Goals are: supervision to min assist. 9. SLP will assess and treat for: cognition.  Goals are: mod I. 10. Case Management and Social Worker will assess and treat for psychological issues and discharge planning. 11. Team conference will be held weekly to assess progress toward goals and to determine barriers to discharge. 12. Patient will receive at least 3 hours of therapy per day at least 5 days per week. 13. ELOS: 2 weeks      Prognosis:  excellent   Medical Problem List and Plan: 1. 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 Multiple thoracic lumbar fractures at T11, T12, L1 and L2 levels. TLSO brace when out of bed 3. Left   scapular fracture. Conservative care with shoulder sling/nonoperative 4. Multiple left rib fractures. Conservative care 5. DVT Prophylaxis/Anticoagulation: SCDs. Monitor for signs of DVT 6. Pain Management: Hydrocodone and Robaxin as needed. Monitor  mental status 7. Neuropsych: This patient is capable of making decisions on his/her own behalf. 8. Hypertension. Lisinopril 10 mg daily. Monitor with increased mobility 9. Coronary artery disease with history of bypass grafting. Will discuss on when aspirin 325 mg can be resumed 10. Hyperlipidemia. Lipitor   07/18/2012, Zach Amonte Brookover,MD 

## 2012-07-18 NOTE — Progress Notes (Signed)
Pt appropriate for CIR.  He & his wife agree to CIR.  Await auth from [facility]  [phone]

## 2012-07-18 NOTE — Discharge Summary (Signed)
Physician Discharge Summary  Patient ID: Rodney Riley MRN: [id number] DOB/AGE: Feb 26, 1946 66 y.o.  Admit date: 07/12/2012 Discharge date: 07/18/2012  Discharge Diagnoses Patient Active Problem List   Diagnosis Date Noted  . Fall 07/18/2012  . Multiple fractures of ribs of left side 07/18/2012  . Traumatic subdural hematoma 07/18/2012  . Traumatic subarachnoid hemorrhage 07/18/2012  . T11 vertebral fracture 07/18/2012  . T12 vertebral fracture 07/18/2012  . L1 vertebral fracture 07/18/2012  . L2 vertebral fracture 07/18/2012  . Left scapula fracture 07/12/2012    Consultants Dr. Daneil Dolin for orthopedic surgery  Dr. Shirlean Kelly for neurosurgery  Dr. Faith Rogue for PM&R   Procedures None   HPI: Rodney Riley was on a ladder cleaning his gutters when he fell approximately 10-12 feet. He denies LOC. He is not amnestic to the event. He complained of left shoulder pain and back pain. His workup, which included CT scans of the head, cervical spine, chest, abdomen, and pelvis as well as x-rays of the left shoulder, demonstrated the above injuries. He was admitted by the trauma service and orthopedic surgery and neurosurgery were consulted.   Hospital Course: The patient remained neurologically normal throughout his hospital stay. Orthopedic surgery recommended non-operative treatment for his scapula fracture. A repeat head CT showed improvement in his brain injury the following day. He was kept at bedrest for 3 days then allowed to mobilize in a TLSO brace with physical and occupational therapies. X-rays of the spine while upright were pending at the time of this summary. Therapies recommended inpatient rehabilitation and they were consulted. He did not suffer any pulmonary compromise due to his rib fractures. He was transferred to inpatient rehabilitation in improved condition.   Hospital Medications Scheduled Meds:   . albuterol-ipratropium  2 puff Inhalation Q6H  .  atorvastatin  40 mg Oral q1800  . bisacodyl  5 mg Oral Daily  . docusate sodium  200 mg Oral BID  . lisinopril  10 mg Oral Daily  . multivitamin with minerals  1 tablet Oral Daily  . polyethylene glycol  17 g Oral Daily  . traMADol  50 mg Oral Q6H  . vitamin E  400 Units Oral Daily   Continuous Infusions:  PRN Meds:.acetaminophen, diphenhydrAMINE, diphenhydrAMINE, HYDROcodone-acetaminophen, influenza  inactive virus vaccine, morphine injection, ondansetron (ZOFRAN) IV, pneumococcal 23 valent vaccine, polyvinyl alcohol, tiZANidine   Home Medications   Medication List     As of 07/18/2012  2:48 PM    TAKE these medications         aspirin 325 MG tablet   Take 325 mg by mouth daily.      multivitamin with minerals Tabs   Take 1 tablet by mouth daily.      quinapril 20 MG tablet   Commonly known as: ACCUPRIL   Take 10 mg by mouth daily.      simvastatin 80 MG tablet   Commonly known as: ZOCOR   Take 80 mg by mouth at bedtime.      vitamin E 400 UNIT capsule   Take 400 Units by mouth daily.         Follow-up Information    Schedule an appointment as soon as possible for a visit with Budd Palmer, MD.   Contact information:   [address] [place] [address] [phone]       Schedule an appointment as soon as possible for a visit with Hewitt Shorts, MD.   Contact information:   [address] [place] [address] [phone]       Call [facility]. (As needed)    Contact information:   [address] [place] [address] [phone]          Signed: Freeman Caldron, PA-C Pager: [phone] [facility] Pager: [phone]  07/18/2012, 2:48 PM

## 2012-07-18 NOTE — Progress Notes (Signed)
Pt been nauseas few times and offered Zofran, but pt states he doesn't need the medication now, he would wait because its not that bad.

## 2012-07-18 NOTE — H&P (Signed)
Physical Medicine and Rehabilitation Admission H&P    Chief Complaint  Patient presents with  . Fall  : HPI: Rodney Riley is a 66 y.o. right-handed male with history of coronary artery disease status post CABG and chronic low back pain. Admitted 07/12/2012 after a fall from a ladder while cleaning his gutters when he fell approximately 10-12 feet without loss of consciousness. He complained of left shoulder pain and back pain. Cranial CT scan shows small amount of subarachnoid hemorrhage as well as small right subdural hematoma without hydrocephalus or midline shift. There is also fracture left occipital bone and diastasis of the left lambdoid suture. Patient also sustained multiple thoracic and lumbar fractures at the T11, T12, L1, and L2 levels. Patient also suffered a left scapular fracture as well as multiple left rib fractures. Neurosurgery Dr. Newell Coral consulted and placed in TLSO back brace for multiple thoracic lumbar fractures with planned followup lumbar thoracic spine films today and observation of subarachnoid subdural hematoma. Followup orthopedic services Dr. Carola Frost in regards to comminuted left scapular fracture felt to be nonoperative and placed in a shoulder sling and plan gentle range of motion of the shoulder. Plan CT of left shoulder 07/18/2012 for followup of scapular fracture reviewed by orthopedic services and continues to recommend non-operative management. Hospital course pain management. Physical and occupational therapy evaluations completed 07/17/2012 ongoing with recommendations of physical medicine rehabilitation consult to consider inpatient rehabilitation services. Patient was felt to be a good candidate for inpatient rehabilitation services and was admitted for comprehensive rehabilitation program  Review of Systems  Gastrointestinal: Positive for constipation.  Musculoskeletal: Positive for back pain.  All other systems reviewed and are negative   Past Medical  History  Diagnosis Date  . Coronary artery disease   . Hypertension   . Low back pain   . Hyperlipidemia    Past Surgical History  Procedure Date  . Cardiac surgery   . Lumbar disc surgery    History reviewed. No pertinent family history. Social History:  reports that he has never smoked. He does not have any smokeless tobacco history on file. His alcohol and drug histories not on file. Allergies: No Known Allergies Medications Prior to Admission  Medication Sig Dispense Refill  . aspirin 325 MG tablet Take 325 mg by mouth daily.      . Multiple Vitamin (MULTIVITAMIN WITH MINERALS) TABS Take 1 tablet by mouth daily.      . quinapril (ACCUPRIL) 20 MG tablet Take 10 mg by mouth daily.      . simvastatin (ZOCOR) 80 MG tablet Take 80 mg by mouth at bedtime.      . vitamin E 400 UNIT capsule Take 400 Units by mouth daily.        Home: Home Living Lives With: Spouse Available Help at Discharge: Available 24 hours/day;Family Type of Home: House Home Access: Stairs to enter [employer] of Steps: 2 Entrance Stairs-Rails: None Home Layout: Two level Alternate Level Stairs-Number of Steps: 13 Alternate Level Stairs-Rails: Left;Right Bathroom Shower/Tub: Walk-in shower;Door Dentist: None   Functional History: Prior Function Able to Take Stairs?: Yes Driving: Yes Vocation: Retired  Functional Status:  Mobility: Bed Mobility Bed Mobility: Not assessed (pt received sitting EOB) Rolling Right: 4: Min assist;With rail Right Sidelying to Sit: 2: Max assist;HOB flat Sitting - Scoot to [place] of Bed: 3: Mod assist Transfers Transfers: Sit to Stand;Stand to Sit Sit to Stand: 1: +2 Total assist;With upper extremity assist;From bed Sit  to Stand: Patient Percentage: 50% Stand to Sit: 1: +2 Total assist;With upper extremity assist;To elevated surface;To chair/3-in-1 Stand to Sit: Patient Percentage:  50% Ambulation/Gait Ambulation/Gait Assistance: 1: +2 Total assist Ambulation/Gait: Patient Percentage: 60% Ambulation Distance (Feet):  (2'x1, 5'x1) Assistive device: 2 person hand held assist Ambulation/Gait Assistance Details: pt with increased weight-shift to R, minimal Wbing through L UE Gait Pattern: Step-to pattern;Decreased stride length;Decreased stance time - left;Decreased stance time - right Gait velocity: slow General Gait Details: pt had to complete gaze stabilization to assist with dizziness Stairs: No    ADL: ADL Grooming: Performed;Set up Where Assessed - Grooming: Supported sitting Lower Body Dressing: Performed;+1 Total assistance Where Assessed - Lower Body Dressing: Supine, head of bed up Toilet Transfer: Simulated;+2 Total assistance Toilet Transfer Method: Sit to stand Toilet Transfer Equipment: Raised toilet seat with arms (or 3-in-1 over toilet) Equipment Used: Gait belt;Back brace Transfers/Ambulation Related to ADLs: pt ambulated with posterior lean and rt lean. pt required max v/c for posterior and facilitation to shift weight. pt reports Rt LE stronger than Lt LE at this time. ADL Comments: Pt supine on arrival with damp gown on. Gown doff and new gown applied. Pt educated on don brace in supine. Pt wife present to observe. Pt rolling to the Rt better than Lt due to rib fxs. Pt total +2 to don TLSO. pt max (A) supine <> sit with immediate vomitting. Pt with large volume of liquid vomit. Pt with vomitting resolving with prolonged sitting and gaze stabilization. Pt required (A) to sit at EOB and presented with posterior right lean. Pt elevated bed for sit <>Stand. Pt returned to chair after first attempt and agreeable to second ambulation attempt. Pt positioned in chair with grooming items at end of session. Wife (A)ing patient with oral care.  Cognition: Cognition Arousal/Alertness: Awake/alert Orientation Level: Oriented X4 Cognition Overall Cognitive  Status: Appears within functional limits for tasks assessed/performed Arousal/Alertness: Awake/alert Orientation Level: Appears intact for tasks assessed Behavior During Session: [facility] for tasks performed   Blood pressure 134/64, pulse 86, temperature 97.9 F (36.6 C), temperature source Oral, resp. rate 16, height 5\' 5"  (1.651 m), weight 64.6 kg (142 lb 6.7 oz), SpO2 97.00%. Physical Exam  Vitals reviewed.  Constitutional: He is oriented to person, place, and time. He appears well-developed.  HENT:  Head: Normocephalic.  Eyes:  Pupils round and reactive to light  Neck: Neck supple. No thyromegaly present.  Cardiovascular: Normal rate and regular rhythm. No murmurs Pulmonary/Chest: Effort normal and breath sounds normal. He has no wheezes. No rales Abdominal: Soft. Bowel sounds are normal. There is no tenderness.  Musculoskeletal: He exhibits no edema. Tender at left shoulder, mid-low back. Scattered bruising Neurological: He is alert and oriented to person, place, and time. No cranial nerve deficit.  Follows three-step commands. Patient can recall full of events of his fall. Mild sensory loss over the lateral left leg and foot. Strength generally 3-4/5 with pain inhibition proximally, especially in the LUE (2-3/5).  Skin: Skin is warm and dry.  Psychiatric: He has a normal mood and affect. His behavior is normal. Judgment and thought content normal   No results found for this or any previous visit (from the past 48 hour(s)). Ct Head Wo Contrast  07/17/2012  *RADIOLOGY REPORT*  Clinical Data: Intracranial hemorrhage.  CT HEAD WITHOUT CONTRAST  Technique:  Contiguous axial images were obtained from the base of the skull through the vertex without contrast.  Comparison: 07/13/2012  Findings: Subtle areas of  subarachnoid hemorrhage in the posterior temporal and posterior parietal regions bilaterally slightly more prominent.  The tiny right temporal subdural hemorrhage is less apparent.  Edema  in the posterior parietal lobes bilaterally as indicated by less prominent cortical sulci than on the study of 07/12/2012.  Probable hairline fracture of the left side of the occipital bone is again noted.  IMPRESSION: Slightly increased subarachnoid hemorrhage.  Increased edema in the posterior parietal lobes without midline shift.   Original Report Authenticated By: Francene Boyers, M.D.    Dg Chest Port 1 View  07/17/2012  *RADIOLOGY REPORT*  Clinical Data: Follow up multiple rib fractures  PORTABLE CHEST - 1 VIEW  Comparison: Chest x-ray 07/13/2012  Findings: No pneumothorax.  Small left pleural effusion and left lower lobe atelectasis.  The right diaphragm is elevated. Unchanged cardiomegaly.  Median sternotomy with evidence of prior multivessel CABG including LIMA bypass.  Healing mildly displaced fractures of the posterior aspects of left ribs three through six. Nondisplaced fractures of the posterior aspects of the lower left ribs better demonstrated on prior CT.  IMPRESSION: 1.  Multiple healing left-sided rib fractures. No pneumothorax. 2.  Left basilar opacity and small left pleural effusion.  The opacity likely represents atelectasis.  Infection is difficult to exclude in the appropriate clinical setting. 3.  Mild cardiomegaly, unchanged.   Original Report Authenticated By: Malachy Moan, M.D.     Post Admission Physician Evaluation: 1. Functional deficits secondary  to TBI, thoraco-lumbar fx's, left scapula fx. 2. Patient is admitted to receive collaborative, interdisciplinary care between the physiatrist, rehab nursing staff, and therapy team. 3. Patient's level of medical complexity and substantial therapy needs in context of that medical necessity cannot be provided at a lesser intensity of care such as a SNF. 4. Patient has experienced substantial functional loss from his/her baseline which was documented above under the "Functional History" and "Functional Status" headings.  Judging by  the patient's diagnosis, physical exam, and functional history, the patient has potential for functional progress which will result in measurable gains while on inpatient rehab.  These gains will be of substantial and practical use upon discharge  in facilitating mobility and self-care at the household level. 5. Physiatrist will provide 24 hour management of medical needs as well as oversight of the therapy plan/treatment and provide guidance as appropriate regarding the interaction of the two. 6. 24 hour rehab nursing will assist with bladder management, bowel management, safety, skin/wound care, disease management, medication administration, pain management and patient education  and help integrate therapy concepts, techniques,education, etc. 7. PT will assess and treat for:  fxnl mobility, pain mgt, balance, NMR, adaptive equipment and techniques, family and pt ed.  Goals are: supervision to min assist. 8. OT will assess and treat for: UES, fxnl mobilty, ADL's, safety, NMR, adaptive equipment .   Goals are: supervision to min assist. 9. SLP will assess and treat for: cognition.  Goals are: mod I. 10. Case Management and Social Worker will assess and treat for psychological issues and discharge planning. 11. Team conference will be held weekly to assess progress toward goals and to determine barriers to discharge. 12. Patient will receive at least 3 hours of therapy per day at least 5 days per week. 13. ELOS: 2 weeks      Prognosis:  excellent   Medical Problem List and Plan: 1. 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 Multiple thoracic lumbar fractures at T11, T12, L1 and L2 levels. TLSO brace when out of bed 3. Left  scapular fracture. Conservative care with shoulder sling/nonoperative 4. Multiple left rib fractures. Conservative care 5. DVT Prophylaxis/Anticoagulation: SCDs. Monitor for signs of DVT 6. Pain Management: Hydrocodone and Robaxin as needed. Monitor  mental status 7. Neuropsych: This patient is capable of making decisions on his/her own behalf. 8. Hypertension. Lisinopril 10 mg daily. Monitor with increased mobility 9. Coronary artery disease with history of bypass grafting. Will discuss on when aspirin 325 mg can be resumed 10. Hyperlipidemia. Lipitor   07/18/2012, Ian Malkin [place],MD

## 2012-07-18 NOTE — Discharge Summary (Signed)
Rodney Haydu, MD, MPH, FACS Pager: [phone]  

## 2012-07-18 NOTE — Interval H&P Note (Signed)
Rodney Riley was admitted today to Inpatient Rehabilitation with the diagnosis of TBI/polytrauma.  The patient's history has been reviewed, patient examined, and there is no change in status.  Patient continues to be appropriate for intensive inpatient rehabilitation.  I have reviewed the patient's chart and labs.  Questions were answered to the patient's satisfaction.  Sintia Mckissic T 07/18/2012, 9:58 PM

## 2012-07-18 NOTE — Plan of Care (Signed)
Overall Plan of Care [facility]) Patient Details Name: Rodney Riley MRN: [id number] DOB: 23-Jan-1946  Diagnosis:  Rehabilitation for TBI,multiple rib fractures, right scapular fracture  Primary Diagnosis:    Trauma Co-morbidities: SIADH  Functional Problem List  Patient demonstrates impairments in the following areas: Balance, Cognition, Endurance, Medication Management, Motor, Pain and Perception  Basic ADL's: eating, grooming, bathing, dressing and toileting Advanced ADL's: simple meal preparation  Transfers:  bed mobility, bed to chair, toilet and tub/shower Locomotion:  ambulation and stairs  Additional Impairments:  Functional use of upper extremity and Discharge Disposition  Anticipated Outcomes Item Anticipated Outcome  Eating/Swallowing  N/A  Basic self-care  supervision  Tolieting  Pt is continent of bowel and bladder  Bowel/Bladder  Pt is continent of bowel and bladder  Transfers  Supervision (OT), modI (PT)  Locomotion  Mod I with LRAD indoor, home distances, no WC goals, supervision stairs 15 to get up to bedroom with bil rails, supervision for outdoor gait  Communication  N/A  Cognition  Supervision  Pain  Pt will rate pain 3 or less on scale 0-10  Safety/Judgment  Pt will call for assist when needed  Other  Pt's skin will remain intact, pt will remain free from falls during admission   Therapy Plan: PT Frequency: 2-3 X/day, 60-90 minutes OT Frequency: 1-2 X/day, 60-90 minutes SLP Frequency: 1-2 X/day, 30-60 minutes   Team Interventions: Item RN PT OT SLP SW TR Other  Self Care/Advanced ADL Retraining  x x      Neuromuscular Re-Education  x x      Therapeutic Activities  x x x     UE/LE Strength Training/ROM  x x      UE/LE Coordination Activities  x x      Visual/Perceptual Remediation/Compensation  x x      DME/Adaptive Equipment Instruction  x x      Therapeutic Exercise  x x      Balance/Vestibular Training  x x      Patient/Family  Education  x x x     Cognitive Remediation/Compensation  x x x     Functional Mobility Training  x x      Ambulation/Gait Training  x       Museum/gallery curator  x       Wheelchair Propulsion/Positioning  x       Functional Tourist information centre manager Reintegration  x x x     Dysphagia/Aspiration Film/video editor         Bladder Management         Bowel Management x        Disease Management/Prevention x        Pain Management x x x      Medication Management x        Skin Care/Wound Management         Splinting/Orthotics x x x      Discharge Planning  x x      Psychosocial Support x x x x                        Team Discharge Planning: Destination:  Home Projected Follow-up:  PT and OT and SLP Projected Equipment Needs:  Scientist, physiological (vs cane, vs none- depends on progress) Patient/family involved in discharge planning:  Yes  MD ELOS: 10-14 days Medical  Rehab Prognosis:  Good Assessment: 66 yo male fell from ladder sustaining multiple fractures and TBI now requiring 24/7 Rehab RN/MD, CIR level PT and OT

## 2012-07-19 ENCOUNTER — Inpatient Hospital Stay (HOSPITAL_COMMUNITY): Payer: Medicare Other

## 2012-07-19 ENCOUNTER — Inpatient Hospital Stay (HOSPITAL_COMMUNITY): Payer: Medicare Other | Admitting: Speech Pathology

## 2012-07-19 ENCOUNTER — Inpatient Hospital Stay (HOSPITAL_COMMUNITY): Payer: Medicare Other | Admitting: Physical Therapy

## 2012-07-19 ENCOUNTER — Inpatient Hospital Stay (HOSPITAL_COMMUNITY): Payer: Medicare Other | Admitting: Occupational Therapy

## 2012-07-19 DIAGNOSIS — S069X9A Unspecified intracranial injury with loss of consciousness of unspecified duration, initial encounter: Secondary | ICD-10-CM

## 2012-07-19 DIAGNOSIS — W11XXXA Fall on and from ladder, initial encounter: Secondary | ICD-10-CM

## 2012-07-19 DIAGNOSIS — T1490XA Injury, unspecified, initial encounter: Secondary | ICD-10-CM

## 2012-07-19 LAB — COMPREHENSIVE METABOLIC PANEL
ALT: 38 U/L (ref 0–53)
AST: 35 U/L (ref 0–37)
Albumin: 2.8 g/dL — ABNORMAL LOW (ref 3.5–5.2)
Alkaline Phosphatase: 158 U/L — ABNORMAL HIGH (ref 39–117)
BUN: 17 mg/dL (ref 6–23)
CO2: 28 mEq/L (ref 19–32)
Calcium: 9.7 mg/dL (ref 8.4–10.5)
Chloride: 89 mEq/L — ABNORMAL LOW (ref 96–112)
Creatinine, Ser: 0.56 mg/dL (ref 0.50–1.35)
GFR calc Af Amer: >90 mL/min (ref >90)
GFR calc non Af Amer: >90 mL/min (ref >90)
Glucose, Bld: 133 mg/dL — ABNORMAL HIGH (ref 70–99)
Potassium: 4 mEq/L (ref 3.5–5.1)
Sodium: 127 mEq/L — ABNORMAL LOW (ref 135–145)
Total Bilirubin: 0.7 mg/dL (ref 0.3–1.2)
Total Protein: 6.6 g/dL (ref 6.0–8.3)

## 2012-07-19 LAB — CBC WITH DIFFERENTIAL/PLATELET
Basophils Absolute: 0 10*3/uL (ref 0.0–0.1)
Basophils Relative: 0 % (ref 0–1)
Eosinophils Absolute: 0.3 10*3/uL (ref 0.0–0.7)
Eosinophils Relative: 4 % (ref 0–5)
HCT: 33.2 % — ABNORMAL LOW (ref 39.0–52.0)
Hemoglobin: 11.8 g/dL — ABNORMAL LOW (ref 13.0–17.0)
Lymphocytes Relative: 16 % (ref 12–46)
Lymphs Abs: 1.4 10*3/uL (ref 0.7–4.0)
MCH: 30.6 pg (ref 26.0–34.0)
MCHC: 35.5 g/dL (ref 30.0–36.0)
MCV: 86.2 fL (ref 78.0–100.0)
Monocytes Absolute: 1.4 10*3/uL — ABNORMAL HIGH (ref 0.1–1.0)
Monocytes Relative: 16 % — ABNORMAL HIGH (ref 3–12)
Neutro Abs: 5.5 10*3/uL (ref 1.7–7.7)
Neutrophils Relative %: 64 % (ref 43–77)
Platelets: 328 10*3/uL (ref 150–400)
RBC: 3.85 MIL/uL — ABNORMAL LOW (ref 4.22–5.81)
RDW: 12.2 % (ref 11.5–15.5)
WBC: 8.6 10*3/uL (ref 4.0–10.5)

## 2012-07-19 NOTE — Progress Notes (Signed)
Patient ID: Rodney Riley, male   DOB: 05/12/1946, 66 y.o.   MRN: [id number]  Subjective/Complaints: Admitted 07/12/2012 after a fall from a ladder while cleaning his gutters when he fell approximately 10-12 feet without loss of consciousness. He complained of left shoulder pain and back pain  Would like pain meds for back pain and mild HA  Objective: Vital Signs: Blood pressure 148/74, pulse 79, temperature 98.6 F (37 C), temperature source Oral, resp. rate 20, height 5' 4.96" (1.65 m), weight 64.6 kg (142 lb 6.7 oz), SpO2 91.00%. Dg Thoracic Spine 2 View  07/18/2012  *RADIOLOGY REPORT*  Clinical Data: Recent fall, with compression fractures.  THORACIC SPINE - 2 VIEW  Comparison: 07/12/2012 CT  Findings: Fractures of T11, T12, L1, L2, better characterized on prior CT. No significant interval change. No appreciable retropulsion however evaluation for this by radiograph is limited. Atherosclerotic vascular calcification.  Status post median sternotomy and CABG.  Bibasilar opacities and small effusions suggested.  Partially imaged multiple posterior left rib fractures.  IMPRESSION: Fractures of T11-L2, better characterized on prior CT. No appreciable interval change.   Original Report Authenticated By: Jearld Lesch, M.D.    Dg Lumbar Spine 2-3 Views  07/18/2012  *RADIOLOGY REPORT*  Clinical Data: Recent fall, with compression fractures.  LUMBAR SPINE - 2-3 VIEW  Comparison: 07/12/2012 CT  Findings: Fractures of T11, T12, L1, L2, better characterized on prior CT. No significant interval change. No appreciable retropulsion however evaluation for this by radiograph is limited. Atherosclerotic vascular calcification.  Status post median sternotomy and CABG.  Bibasilar opacities and small effusions suggested.  Partially imaged multiple posterior left rib fractures.  IMPRESSION:  Fractures of T11-L2, better characterized on prior CT. No appreciable significant interval change.   Original Report  Authenticated By: Jearld Lesch, M.D.    Ct Shoulder Left Wo Contrast  07/18/2012  **ADDENDUM** CREATED: 07/18/2012 13:56:24  *RADIOLOGY REPORT*  3-DIMENSIONAL CT IMAGE RENDERING AT INDEPENDENT WORKSTATION:  Technique:  3-dimensional CT images were rendered by post- processing of the original CT data at independent workstation.  The 3-dimensional CT images were interpreted, and findings are reported in the accompanying complete CT report for this study.  3-D images were created and better demonstrate the relationship of the multiple scapular fracture fragments one another including the comminuted fracture of the superior aspect of the glenoid.  **END ADDENDUM** SIGNED BY: Allayne Gitelman. Jena Gauss, M.D.   07/18/2012  *RADIOLOGY REPORT*  Clinical Data: Left scapular fracture.  Multiple rib fractures.  CT OF THE LEFT SHOULDER WITHOUT CONTRAST  Technique:  Multidetector CT imaging was performed according to the standard protocol. Multiplanar CT image reconstructions were also generated.  Comparison: Radiographs dated 07/12/2012  Findings: There is a comminuted slightly impacted fracture of the undersurface of the distal clavicle.  The distal clavicle is also deformed secondary to a prior well healed fracture.  There is a fracture of the base of the acromion with 7 mm of distraction.  There is also a fracture through the base of the coracoid with 6 mm of distraction.  This fracture extends into the superior aspect of the glenoid.  The articular surface of the glenoid is fragmented and slightly impacted.  Linear fractures extend along the superior aspect of the scapula to the medial border.  Note is made of fractures of the left second through 6th ribs and left eighth rib.  There is a left pleural effusion with compressive atelectasis in the left lower lobe.  The proximal humerus is  intact.  No dislocation.  IMPRESSION:  1.  Comminuted fracture of the scapula involving the bases of the coracoid and acromial processes as  well as the superior aspect of the glenoid extending into the body of the scapula. 2.  Fracture of the distal clavicle. 3.  Multiple left rib fractures.   Original Report Authenticated By: Francene Boyers, M.D.    Results for orders placed during the hospital encounter of 07/18/12 (from the past 72 hour(s))  CBC WITH DIFFERENTIAL     Status: Abnormal   Collection Time   07/19/12  6:15 AM      Component Value Range Comment   WBC 8.6  4.0 - 10.5 K/uL    RBC 3.85 (*) 4.22 - 5.81 MIL/uL    Hemoglobin 11.8 (*) 13.0 - 17.0 g/dL    HCT 16.1 (*) 09.6 - 52.0 %    MCV 86.2  78.0 - 100.0 fL    MCH 30.6  26.0 - 34.0 pg    MCHC 35.5  30.0 - 36.0 g/dL    RDW 04.5  40.9 - 81.1 %    Platelets 328  150 - 400 K/uL    Neutrophils Relative 64  43 - 77 %    Neutro Abs 5.5  1.7 - 7.7 K/uL    Lymphocytes Relative 16  12 - 46 %    Lymphs Abs 1.4  0.7 - 4.0 K/uL    Monocytes Relative 16 (*) 3 - 12 %    Monocytes Absolute 1.4 (*) 0.1 - 1.0 K/uL    Eosinophils Relative 4  0 - 5 %    Eosinophils Absolute 0.3  0.0 - 0.7 K/uL    Basophils Relative 0  0 - 1 %    Basophils Absolute 0.0  0.0 - 0.1 K/uL      HEENT: normal Cardio: RRR Resp: CTA B/L GI: BS positive Extremity:  Pulses positive Skin:   Intact Neuro: Anxious, Cranial Nerve II-XII normal, Normal Sensory and Abnormal Motor 5/5 RUE,4/5 L delt and tri, 5/5 grip, 4/5 in BLE Musc/Skel:  Other No pain with UE or LE AROM Oriented to person and place but not day and date   Assessment/Plan: 1. Functional deficits secondary to TBI, thoraco-lumbar fx's, left scapula fx  which require 3+ hours per day of interdisciplinary therapy in a comprehensive inpatient rehab setting. Physiatrist is providing close team supervision and 24 hour management of active medical problems listed below. Physiatrist and rehab team continue to assess barriers to discharge/monitor patient progress toward functional and medical goals. FIM:                    Comprehension Comprehension Mode: Auditory Comprehension: 5-Understands basic 90% of the time/requires cueing < 10% of the time  Expression Expression Mode: Verbal Expression: 5-Expresses basic 90% of the time/requires cueing < 10% of the time.  Social Interaction Social Interaction: 5-Interacts appropriately 90% of the time - Needs monitoring or encouragement for participation or interaction.  Problem Solving Problem Solving: 5-Solves basic 90% of the time/requires cueing < 10% of the time  Memory Memory: 5-Recognizes or recalls 90% of the time/requires cueing < 10% of the time  Medical Problem List and Plan:  1. 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 Multiple thoracic lumbar fractures at T11, T12, L1 and L2 levels. TLSO brace when out of bed  3. Left scapular fracture. Conservative care with shoulder sling/nonoperative  4. Multiple left rib fractures. Conservative care  5. DVT Prophylaxis/Anticoagulation: SCDs. Monitor  for signs of DVT  6. Pain Management: Hydrocodone and Robaxin as needed. Monitor mental status  7. Neuropsych: This patient is capable of making decisions on his/her own behalf.  8. Hypertension. Lisinopril 10 mg daily. Monitor with increased mobility  9. Coronary artery disease with history of bypass grafting. Will discuss on when aspirin 325 mg can be resumed  10. Hyperlipidemia. Lipitor   LOS (Days) 1 A FACE TO FACE EVALUATION WAS PERFORMED  KIRSTEINS,ANDREW E 07/19/2012, 7:03 AM

## 2012-07-19 NOTE — Progress Notes (Signed)
Pt went to Xray, came back and vomited times 1, zofran given with relief, pt refused soap suds enema now and is requesting it next shift

## 2012-07-19 NOTE — Progress Notes (Signed)
Physical Therapy Session Note  Patient Details  Name: Rodney Riley MRN: [id number] Date of Birth: Oct 04, 1945  Today's Date: 07/19/2012 Time: 7829-5621 Time Calculation (min): 37 min  Short Term Goals: Week 1:  PT Short Term Goal 1 (Week 1): Pt to independently verbalize 3/3 back precautions and demonstrate compliance with the back precautions 100% of treatment session.   PT Short Term Goal 2 (Week 1): Pt to perform bed mobility with supervision PT Short Term Goal 3 (Week 1): Transfers supervision with RW PT Short Term Goal 4 (Week 1): Gait >150' with RW supervision PT Short Term Goal 5 (Week 1): Stairs with bil rails 10 with supervision  Skilled Therapeutic Interventions/Progress Updates:    This session focused on bed mobility, doffing the brace, and using compensatory methods to control his dizziness until we can do his vestibular evaluation and start treatment for him.  Rolling left and right min assist to help with keeping hips and shoulders together to doff brace.  Sit to sidelying right mod assist of bil legs HOB kept at 30 degrees and pt finding targets around the room to help control dizziness with transition.  Sit to stand min guard assist from [facility], from bed and from mat table with verbal cues for safe hand placement.  Gait with RW >300' with min assist for balance.  Wife followed with WC in case rest breaks were needed, but they were not.  Pt requested a higher back WC so he could rest his head while he is up in the [facility], so PT switched out WC, but needed to find matching standard leg rests (informed tech, [facility]).  Reinforced back precautions and functional examples in daily life of how to modify how he does things to follow his back precautions.    Therapy Documentation Precautions:  Precautions Precautions: Back;Fall Type of Shoulder Precautions: AROM as tolerated; has a sling for comfort Required Braces or Orthoses: Spinal Brace Spinal Brace: Thoracolumbosacral orthotic;Applied  in supine position Restrictions Other Position/Activity Restrictions: limited LT UE weight bearing General:  Pain: Pain Assessment Pain Assessment: 0-10 Pain Score:   4 Pain Type: Acute pain Pain Location: Back Pain Orientation: Lower Pain Descriptors: Aching Pain Frequency: Intermittent Pain Onset: Gradual Patients Stated Pain Goal: 2 Pain Intervention(s): Medication (See eMAR)  See FIM for current functional status  Therapy/Group: Individual Therapy  Lurena Joiner B. Kerri-Anne Haeberle, PT, DPT [phone]   07/19/2012, 4:34 PM

## 2012-07-19 NOTE — Evaluation (Signed)
Speech Language Pathology Assessment and Plan  Patient Details  Name: Rodney Riley MRN: [id number] Date of Birth: [birth year]  SLP Diagnosis: Cognitive Impairments  Rehab Potential: Excellent ELOS: 2 weeks   Today's Date: 07/19/2012 Time: 4098-1191 Time Calculation (min): 69 min  Problem List:  Patient Active Problem List  Diagnosis  . Left scapula fracture  . Fall  . Multiple fractures of ribs of left side  . Traumatic subdural hematoma  . Traumatic subarachnoid hemorrhage  . T11 vertebral fracture  . T12 vertebral fracture  . L1 vertebral fracture  . L2 vertebral fracture  . Trauma   Past Medical History:  Past Medical History  Diagnosis Date  . Coronary artery disease   . Hypertension   . Low back pain   . Hyperlipidemia    Past Surgical History:  Past Surgical History  Procedure Date  . Cardiac surgery   . Lumbar disc surgery     Assessment / Plan / Recommendation Clinical Impression  Rodney Riley is a 66 y.o. right-handed male with history of coronary artery disease status post CABG and chronic low back pain. Admitted 07/12/2012 after a fall from a ladder while cleaning his gutters when he fell approximately 10-12 feet without loss of consciousness. He complained of left shoulder pain and back pain. Cranial CT scan shows small amount of subarachnoid hemorrhage as well as small right subdural hematoma without hydrocephalus or midline shift. There is also fracture left occipital bone and diastasis of the left lambdoid suture. Patient also sustained multiple thoracic and lumbar fractures at the T11, T12, L1, and L2 levels. Patient also suffered a left scapular fracture as well as multiple left rib fractures. Neurosurgery Dr. Newell Coral consulted and placed in TLSO back brace for multiple thoracic lumbar fractures with planned followup lumbar thoracic spine films today and observation of subarachnoid subdural hematoma. Followup orthopedic services Dr. Carola Frost in  regards to comminuted left scapular fracture felt to be nonoperative and placed in a shoulder sling and plan gentle range of motion of the shoulder. Plan CT of left shoulder 07/18/2012 for followup of scapular fracture reviewed by orthopedic services and continues to recommend non-operative management. Patient transferred to inpatient rehab on 07/18/12 and presents with high level cognitive deficits including decreased processing time and self monitoring resulting in impaired problem solving with complex functional tasks. Pt would benefit from skilled speech therapy in inpatient rehab to improve cognitive funciton and develop compensatory strategies and awareness for increased independence with ADLs.     SLP Assessment  Patient will need skilled Speech Lanaguage Pathology Services during CIR admission    Recommendations  Follow up Recommendations: Outpatient SLP    SLP Frequency 1-2 X/day, 30-60 minutes   SLP Treatment/Interventions Cognitive remediation/compensation;Cueing hierarchy;Functional tasks;Patient/family education;Therapeutic Activities    Pain Pain Assessment Pain Assessment: No/denies pain Pain Score:   4 Pain Type: Acute pain Pain Location: Back Pain Orientation: Lower Pain Descriptors: Aching Pain Frequency: Intermittent Pain Onset: Gradual Patients Stated Pain Goal: 2 Pain Intervention(s): Medication (See eMAR) Prior Functioning Cognitive/Linguistic Baseline: Within functional limits Type of Home: House Lives With: Spouse Available Help at Discharge: Family;Available 24 hours/day Vocation: Retired  Teacher, music Term Goals: Week 1: SLP Short Term Goal 1 (Week 1): Pt will utilize compensatory strategies for self monitoring during high level functional problem solving task with supervision cues.  SLP Short Term Goal 2 (Week 1): Pt will demonstrate divided attention during high level problem solving task with supervision cues.  SLP Short Term Goal 3 (Week  1): Pt will verbalize  anticipatory awareness of deficits for functional tasks with supervision cues.   See FIM for current functional status Refer to Care Plan for Long Term Goals  Recommendations for other services: None  Discharge Criteria: Patient will be discharged from SLP if patient refuses treatment 3 consecutive times without medical reason, if treatment goals not met, if there is a change in medical status, if patient makes no progress towards goals or if patient is discharged from hospital.  The above assessment, treatment plan, treatment alternatives and goals were discussed and mutually agreed upon: by patient and family  [name], [name] 07/19/2012, 3:05 PM

## 2012-07-19 NOTE — Progress Notes (Signed)
Occupational Therapy Session Note  Patient Details  Name: Rodney Riley MRN: [id number] Date of Birth: 1945-09-19  Today's Date: 07/19/2012 Time: 1030-1130 Time Calculation (min): 60 min  Short Term Goals: Week 1:  OT Short Term Goal 1 (Week 1): Use gaze stabilitzation techniques for transfers to decrease nausea OT Short Term Goal 2 (Week 1): Pt will transfer to toilet/ BSC with min A OT Short Term Goal 3 (Week 1): Pt will perform toileting 3/3 steps with mod A OT Short Term Goal 4 (Week 1): Pt will roll in bed (side to side) with supervision for TLSO to be donned OT Short Term Goal 5 (Week 1): Pt will don shirt in supine position with mod A (rolling)  Skilled Therapeutic Interventions/Progress Updates:  Pt seen for functional transfers, visual gaze stabilization with mobility, LUE gentle AROM, and dressing tasks. Pt ambulated within room with very close supervision as he visual focused on a steady object. Pt responded well to cues to do this and was then able to engage in gaze stabilization independently.  He stated that he felt that this helped reduce his motion sickness.  Pt was able to sit down and stand up on standard toilet using right hand grab bar with steady assist.  He then worked on gentle pendulum exercises from sitting upright, elbow flexion, and shoulder flexion to only 20 degrees.   Pt's wife had brought his clothing, so pullover shirt and shorts donned from supine. Pt did well with log rolling to don shorts over hips and manage shirt and brace.  Pt donned shirt over left arm first to avoid excessive shoulder movement.  Pt stated he was more comfortable without the arm sling.   Therapy Documentation Precautions:  Precautions Precautions: Back;Fall Type of Shoulder Precautions: AROM as tolerated; has a sling for comfort Required Braces or Orthoses: Spinal Brace Spinal Brace: Thoracolumbosacral orthotic;Applied in supine position Restrictions Other Position/Activity  Restrictions: limited LT UE weight bearing  Pain: Pain Assessment Pain Assessment: No/denies pain  ADL:  See FIM for current functional status  Therapy/Group: Individual Therapy  SAGUIER,JULIA 07/19/2012, 11:43 AM

## 2012-07-19 NOTE — Progress Notes (Signed)
Patient information reviewed and entered into eRehab system by Jahree Dermody, RN, CRRN, PPS Coordinator.  Information including medical coding and functional independence measure will be reviewed and updated through discharge.     Per nursing patient was given "Data Collection Information Summary for Patients in Inpatient Rehabilitation Facilities with attached "Privacy Act Statement-Health Care Records" upon admission.  

## 2012-07-19 NOTE — Evaluation (Signed)
Physical Therapy Assessment and Plan  Patient Details  Name: Rodney Riley MRN: [id number] Date of Birth: [birth year]  PT Diagnosis: Abnormality of gait, Difficulty walking, Dizziness and giddiness, Low back pain, Muscle weakness and Vertigo Rehab Potential: Good ELOS: 14 days   Today's Date: 07/19/2012 Time: 1191-4782 Time Calculation (min): 58 min  Problem List:  Patient Active Problem List  Diagnosis  . Left scapula fracture  . Fall  . Multiple fractures of ribs of left side  . Traumatic subdural hematoma  . Traumatic subarachnoid hemorrhage  . T11 vertebral fracture  . T12 vertebral fracture  . L1 vertebral fracture  . L2 vertebral fracture  . Trauma    Past Medical History:  Past Medical History  Diagnosis Date  . Coronary artery disease   . Hypertension   . Low back pain   . Hyperlipidemia    Past Surgical History:  Past Surgical History  Procedure Date  . Cardiac surgery   . Lumbar disc surgery     Assessment & Plan Clinical Impression:  Patient is a 66 y.o. year old right-handed male with history of coronary artery disease status post CABG and chronic low back pain (s/p two separate disc surgeries). Admitted 07/12/2012 after a fall from a ladder while cleaning his gutters when he fell approximately 10-12 feet without loss of consciousness. He complained of left shoulder pain and back pain. Cranial CT scan shows small amount of subarachnoid hemorrhage as well as small right subdural hematoma without hydrocephalus or midline shift. There is also fracture left occipital bone and diastasis of the left lambdoid suture. Patient also sustained multiple thoracic and lumbar fractures at the T11, T12, L1, and L2 levels. Patient also suffered a left scapular fracture as well as multiple left rib fractures. Neurosurgery Dr. Newell Coral consulted and placed in TLSO back brace for multiple thoracic lumbar fractures with planned followup lumbar thoracic spine films today and  observation of subarachnoid subdural hematoma. Followup orthopedic services Dr. Carola Frost in regards to comminuted left scapular fracture felt to be nonoperative and placed in a shoulder sling and plan gentle range of motion of the shoulder. Plan CT of left shoulder 07/18/2012 for followup of scapular fracture reviewed by orthopedic services and continues to recommend non-operative management.   Patient transferred to CIR on 07/18/2012 .   Patient currently requires min with mobility secondary to muscle weakness and vertigo.  Prior to hospitalization, patient was completely independent with mobility and lived with Spouse in a House home.  Home access is 2Stairs to enter.  Patient will benefit from skilled PT intervention to maximize safe functional mobility, minimize fall risk and decrease caregiver burden for planned discharge home with 24 hour assist.  Anticipate patient will benefit from follow up [facility] at discharge.  PT - End of Session Endurance Deficit: Yes Endurance Deficit Description: limited by strength and pain PT Assessment Rehab Potential: Good PT Plan PT Frequency: 2-3 X/day, 60-90 minutes Estimated Length of Stay: 14 days PT Treatment/Interventions: Ambulation/gait training;Balance/vestibular training;Cognitive remediation/compensation;Community reintegration;Discharge planning;DME/adaptive equipment instruction;Functional mobility training;Pain management;Neuromuscular re-education;Patient/family education;Psychosocial support;Splinting/orthotics;Stair training;Therapeutic Activities;UE/LE Strength taining/ROM;Therapeutic Exercise;Wheelchair propulsion/positioning PT Recommendation Follow Up Recommendations: Home health PT;24 hour supervision/assistance (home safety eval) Equipment Recommended: Rolling walker with 5" wheels Equipment Details: depends on progress, he may need cane or no walking assistive device.   PT Evaluation Precautions/Restrictions Precautions Precautions:  Back;Fall Type of Shoulder Precautions: AROM as tolerated; has a sling for comfort Required Braces or Orthoses: Spinal Brace Spinal Brace: Thoracolumbosacral orthotic;Applied in supine position Restrictions  Other Position/Activity Restrictions: limited LT UE weight bearing General  Vital Signs   Pain Pain Assessment Pain Assessment: 0-10 Pain Score:   4 Pain Type: Acute pain Pain Location: Back Pain Orientation: Lower Pain Descriptors: Aching Pain Frequency: Intermittent Pain Onset: Gradual Patients Stated Pain Goal: 3 Pain Intervention(s): Repositioned;Ambulation/increased activity Home Living/Prior Functioning Home Living Lives With: Spouse Available Help at Discharge: Available 24 hours/day;Family Type of Home: House Home Access: Stairs to enter [employer] of Steps: 2 Entrance Stairs-Rails: None Home Layout: Two level Alternate Level Stairs-Number of Steps:  (8, landing 7) Alternate Level Stairs-Rails: Right;Left;Can reach both Bathroom Shower/Tub: Walk-in shower;Door [employer] Toilet: Standard Home Adaptive Equipment: None Prior Function Level of Independence: Independent with basic ADLs;Independent with homemaking with ambulation;Independent with gait;Independent with transfers Able to Take Stairs?: Yes Driving: Yes Vocation: Retired Leisure: Hobbies-yes (Comment) Comments: likes golf, goes to church (several days per week), Wants to visit grandchild in [place].   Vision/Perception  Vision - History Baseline Vision: Wears glasses all the time Patient Visual Report: Nausea/blurring vision with head movement Vision - Assessment Eye Alignment: Within Functional Limits Saccades: Decreased speed of saccadic movement Additional Comments: Pt with increased "swimmy headed" with rolling and head turns to the right and with transitional movements. Nausea and vomitting with right slidelying to sit Praxis Praxis: Intact  Cognition Arousal/Alertness:  Awake/alert Orientation Level: Oriented X4 Attention: Selective;Alternating Selective Attention: Appears intact Alternating Attention: Impaired Safety/Judgment: Appears intact [place] Scales of Cognitive Functioning: Purposeful/appropriate Sensation Sensation Light Touch: Appears Intact Stereognosis: Appears Intact Hot/Cold: Appears Intact Proprioception: Appears Intact Coordination Gross Motor Movements are Fluid and Coordinated: Yes Fine Motor Movements are Fluid and Coordinated: Yes Motor  Motor Motor - Skilled Clinical Observations: generalized weakness and acute pain  Mobility Bed Mobility Rolling Right: 4: Min assist Rolling Left: 4: Min assist Right Sidelying to Sit: 4: Min assist Sitting - Scoot to Edge of Bed: 6: Modified independent (Device/Increase time) Transfers Sit to Stand: 4: Min assist Stand to Sit: 4: Min Multimedia programmer Transfers: 4: Min Actuary Details (indicate cue type and reason): with RW Locomotion  Ambulation Ambulation/Gait Assistance: 4: Min assist Ambulation Distance (Feet): 80 Feet Assistive device: Rolling walker  Trunk/Postural Assessment  Cervical Assessment Cervical Assessment: Within Functional Limits Thoracic Assessment Thoracic Assessment: Exceptions to [facility] (limited by brace) Lumbar Assessment Lumbar Assessment: Exceptions to [facility] (limited by brace) Postural Control Postural Control: Deficits on evaluation Trunk Control: requires min A with UE support in unsupported sitting Righting Reactions: delayed  Balance Static Sitting Balance Static Sitting - Balance Support: Bilateral upper extremity supported Static Sitting - Level of Assistance: 5: Stand by assistance Static Standing Balance Static Standing - Balance Support: No upper extremity supported Static Standing - Level of Assistance: 3: Mod assist Extremity Assessment  RUE Assessment RUE Assessment: Within Functional Limits LUE  Assessment LUE Assessment: Exceptions to [facility] (normal ROM in elbow, wrist and hand. shoulder flexion to 60) RLE Assessment RLE Assessment: Exceptions to [facility] RLE Strength RLE Overall Strength Comments: ankle 5/5, knee 5/5 ext, 4/5 flexion, hip flexion 4/5 LLE Assessment LLE Assessment: Exceptions to [facility] LLE Strength LLE Overall Strength Comments: all 5/5 except left hip flexion 4/5. May be limited by pain and pulling into the low back.     Skilled interventions:  We focused our session on bed mobility utilizing the log roll technique min assist to get to right side, side to sit min assist to get up to sitting and sit to right  side supervision for safety (pt able to manage his own legs).  Sit to stand and transfers into and out of the WC (level transfers) with RW min assist to steady pt for balance, especially while backing up.  Gait 45' with RW min assist for balance.  Some vertigo and nystagmus noted with position changes especially going from sitting to sidelying to supine of the right.  < 30 second duration.  Seemed to be upbeating nystagmus, but did not perform true dix hallpike test. Reviewed back precautions and brace use with pt and family.  He was only able to report one precaution independently.  He did remember 3/3 word recall at 5 mins.  Stairs with bil rails reciprocal pattern min assist for balance x 10 steps both 4 and 6" steps. Educated pt on locking the [facility] for safety during transfers.    See FIM for current functional status  Refer to Care Plan for Long Term Goals  Recommendations for other services: None and Other: rec therapy evaluation  Discharge Criteria: Patient will be discharged from PT if patient refuses treatment 3 consecutive times without medical reason, if treatment goals not met, if there is a change in medical status, if patient makes no progress towards goals or if patient is discharged from hospital.  The above assessment, treatment plan, treatment alternatives  and goals were discussed and mutually agreed upon: by patient and by family  Lurena Joiner B. Shafer Swamy, PT, DPT [phone]   07/19/2012, 10:22 AM

## 2012-07-19 NOTE — Evaluation (Signed)
Occupational Therapy Assessment and Plan  Patient Details  Name: Rodney Riley MRN: [id number] Date of Birth: 1946/01/17  OT Diagnosis: acute pain, cognitive deficits and muscle weakness (generalized) Rehab Potential: Rehab Potential: Good ELOS: 2 weeks   Today's Date: 07/19/2012 Time: 0730-0830 Time Calculation (min): 60 min  Problem List:  Patient Active Problem List  Diagnosis  . Left scapula fracture  . Fall  . Multiple fractures of ribs of left side  . Traumatic subdural hematoma  . Traumatic subarachnoid hemorrhage  . T11 vertebral fracture  . T12 vertebral fracture  . L1 vertebral fracture  . L2 vertebral fracture  . Trauma    Past Medical History:  Past Medical History  Diagnosis Date  . Coronary artery disease   . Hypertension   . Low back pain   . Hyperlipidemia    Past Surgical History:  Past Surgical History  Procedure Date  . Cardiac surgery   . Lumbar disc surgery     Assessment & Plan Clinical Impression: Patient is a 66 y.o. year old right-handed male with history of coronary artery disease status post CABG and chronic low back pain. Admitted 07/12/2012 after a fall from a ladder while cleaning his gutters when he fell approximately 10-12 feet without loss of consciousness. He complained of left shoulder pain and back pain. Cranial CT scan shows small amount of subarachnoid hemorrhage as well as small right subdural hematoma without hydrocephalus or midline shift. There is also fracture left occipital bone and diastasis of the left lambdoid suture. Patient also sustained multiple thoracic and lumbar fractures at the T11, T12, L1, and L2 levels. Patient also suffered a left scapular fracture as well as multiple left rib fractures. Neurosurgery Dr. Newell Coral consulted and placed in TLSO back brace for multiple thoracic lumbar fractures with planned followup lumbar thoracic spine films today and observation of subarachnoid subdural hematoma. Followup  orthopedic services Dr. Carola Frost in regards to comminuted left scapular fracture felt to be nonoperative and placed in a shoulder sling and plan gentle range of motion of the shoulder. Plan CT of left shoulder 07/18/2012 for followup of scapular fracture reviewed by orthopedic services and continues to recommend non-operative management. .  Patient transferred to CIR on 07/18/2012 .    Patient currently requires max with basic self-care skills and mod A with basic mobility  secondary to muscle weakness and acute pain, decreased cardiorespiratoy endurance, decreased attention, decreased awareness and (higher levels of cognition, central origin and decreased sitting balance, decreased standing balance, decreased postural control and decreased balance strategies.  Prior to hospitalization, patient could complete ADL with independence.  Patient will benefit from skilled intervention to decrease level of assist with basic self-care skills and increase independence with basic self-care skills prior to discharge home with care partner.  Anticipate patient will require intermittent supervision and minimal physical assistance and follow up home health.  OT - End of Session Endurance Deficit: Yes Endurance Deficit Description: limited by nausea and vomitting OT Assessment Rehab Potential: Good OT Plan OT Frequency: 1-2 X/day, 60-90 minutes Estimated Length of Stay: 2 weeks OT Treatment/Interventions: Balance/vestibular training;Discharge planning;Cognitive remediation/compensation;DME/adaptive equipment instruction;Community reintegration;Neuromuscular re-education;Self Care/advanced ADL retraining;Therapeutic Exercise;Therapeutic Activities;Psychosocial support;Functional mobility training;Patient/family education;Pain management;Splinting/orthotics;Skin care/wound managment;UE/LE Strength taining/ROM;UE/LE Coordination activities;Visual/perceptual remediation/compensation;Wheelchair propulsion/positioning OT  Recommendation Follow Up Recommendations: Home health OT Equipment Recommended: Tub/shower seat  OT Evaluation Precautions/Restrictions  Precautions Precautions: Back;Fall Type of Shoulder Precautions: AROM as tolerated; has a sling for comfort Required Braces or Orthoses: Spinal Brace Spinal Brace: Thoracolumbosacral orthotic;Applied  in supine position Restrictions Other Position/Activity Restrictions: limited LT UE weight bearing General Chart Reviewed: Yes Family/Caregiver Present: No Vital Signs Therapy Vitals Temp: 98.6 F (37 C) Temp src: Oral Pulse Rate: 79  Resp: 20  BP: 148/74 mmHg Patient Position, if appropriate: Lying Oxygen Therapy SpO2: 91 % O2 Device: None (Room air) Pain Pain Assessment Pain Assessment: 0-10 Pain Score:   3 Pain Type: Acute pain Pain Location: Head Pain Descriptors: Aching Pain Frequency: Intermittent Pain Onset: Gradual Patients Stated Pain Goal: 3 Pain Intervention(s): Medication (See eMAR) Home Living/Prior Functioning Home Living Lives With: Spouse Available Help at Discharge: Available 24 hours/day;Family Type of Home: House Home Access: Stairs to enter Secretary/administrator of Steps: 2 Home Layout: Two level Bathroom Shower/Tub: Health visitor: Standard ADL  see FIM Vision/Perception  Vision - History Baseline Vision: Wears glasses all the time Patient Visual Report: Nausea/blurring vision with head movement Vision - Assessment Eye Alignment: Within Functional Limits Saccades: Decreased speed of saccadic movement Additional Comments: Pt with increased "swimmy headed" with rolling and head turns to the right and with transitional movements. Nausea and vomitting with right slidelying to sit Praxis Praxis: Intact  Cognition Arousal/Alertness: Awake/alert Orientation Level: Oriented X4 Attention: Selective;Alternating Selective Attention: Appears intact Alternating Attention:  Impaired Safety/Judgment: Appears intact [place] Scales of Cognitive Functioning: Purposeful/appropriate Sensation Sensation Light Touch: Appears Intact Stereognosis: Appears Intact Hot/Cold: Appears Intact Proprioception: Appears Intact Coordination Gross Motor Movements are Fluid and Coordinated: Yes Fine Motor Movements are Fluid and Coordinated: Yes Motor  Motor Motor - Skilled Clinical Observations: generalized weakness and acute pain Mobility  Bed Mobility Rolling Right: 4: Min assist Rolling Left: 4: Min assist Right Sidelying to Sit: 3: Mod assist Sitting - Scoot to Edge of Bed: 3: Mod assist Transfers Sit to Stand: 4: Min assist Stand to Sit: 4: Min assist  Trunk/Postural Assessment  Cervical Assessment Cervical Assessment: Within Functional Limits Thoracic Assessment Thoracic Assessment: Exceptions to [facility] (limited by brace) Lumbar Assessment Lumbar Assessment: Exceptions to [facility] (limited by brace) Postural Control Postural Control: Deficits on evaluation Trunk Control: requires min A with UE support in unsupported sitting Righting Reactions: delayed  Balance Static Sitting Balance Static Sitting - Balance Support: Bilateral upper extremity supported Static Sitting - Level of Assistance: 5: Stand by assistance Static Standing Balance Static Standing - Balance Support: No upper extremity supported Static Standing - Level of Assistance: 3: Mod assist Extremity/Trunk Assessment RUE Assessment RUE Assessment: Within Functional Limits LUE Assessment LUE Assessment: Exceptions to [facility] (normal ROM in elbow, wrist and hand. shoulder flexion to 60)  See FIM for current functional status Refer to Care Plan for Long Term Goals  Recommendations for other services: Neuropsych  Discharge Criteria: Patient will be discharged from OT if patient refuses treatment 3 consecutive times without medical reason, if treatment goals not met, if there is a change in  medical status, if patient makes no progress towards goals or if patient is discharged from hospital.  The above assessment, treatment plan, treatment alternatives and goals were discussed and mutually agreed upon: by patient and by family  1:1 OT eval initiated: OT role, purpose and goals discussed with pt Focus on bathing UB at bed level, donning brace and learning how to know if it is in the proper place to assist caregivers, rolling (with increased nausea with rolling to the right), side lying to sit, stand pivot transfer to w/c. Once got to the w/c and turn chair to sink pt became sick -  throwing up twice. RN made aware and gave meds. Performed grooming at sink.   Roney Mans [facility] 07/19/2012, 9:05 AM

## 2012-07-19 NOTE — Progress Notes (Signed)
Pt states that he is not interested in wearing the BiPap QHS. He does not feel that he needs it. Pt tolerating RA well. RT will continue to monitor.

## 2012-07-20 ENCOUNTER — Inpatient Hospital Stay (HOSPITAL_COMMUNITY): Payer: Medicare Other | Admitting: Physical Therapy

## 2012-07-20 ENCOUNTER — Inpatient Hospital Stay (HOSPITAL_COMMUNITY): Payer: Medicare Other | Admitting: Speech Pathology

## 2012-07-20 ENCOUNTER — Inpatient Hospital Stay (HOSPITAL_COMMUNITY): Payer: Medicare Other

## 2012-07-20 DIAGNOSIS — W11XXXA Fall on and from ladder, initial encounter: Secondary | ICD-10-CM

## 2012-07-20 DIAGNOSIS — S069X9A Unspecified intracranial injury with loss of consciousness of unspecified duration, initial encounter: Secondary | ICD-10-CM

## 2012-07-20 MED ORDER — SENNOSIDES-DOCUSATE SODIUM 8.6-50 MG PO TABS
2.0000 | ORAL_TABLET | Freq: Two times a day (BID) | ORAL | Status: DC
  Administered 2012-07-20 – 2012-07-22 (×4): 2 via ORAL
  Filled 2012-07-20 (×5): qty 2
  Filled 2012-07-20: qty 1

## 2012-07-20 MED ORDER — PRAMOXINE HCL 1 % RE FOAM
Freq: Three times a day (TID) | RECTAL | Status: DC
  Filled 2012-07-20: qty 15

## 2012-07-20 MED ORDER — HYDROCORTISONE ACE-PRAMOXINE 1-1 % RE FOAM
1.0000 | Freq: Three times a day (TID) | RECTAL | Status: DC
  Administered 2012-07-20 – 2012-07-24 (×5): 1 via RECTAL
  Filled 2012-07-20: qty 10

## 2012-07-20 NOTE — Progress Notes (Signed)
Physical Therapy Session Note  Patient Details  Name: Rodney Riley MRN: [id number] Date of Birth: 07/21/1946  Today's Date: 07/20/2012 Time: 1100-1200 Time Calculation (min): 60 min  Short Term Goals: Week 1:  PT Short Term Goal 1 (Week 1): Pt to independently verbalize 3/3 back precautions and demonstrate compliance with the back precautions 100% of treatment session.   PT Short Term Goal 2 (Week 1): Pt to perform bed mobility with supervision PT Short Term Goal 3 (Week 1): Transfers supervision with RW PT Short Term Goal 4 (Week 1): Gait >150' with RW supervision PT Short Term Goal 5 (Week 1): Stairs with bil rails 10 with supervision  Skilled Therapeutic Interventions/Progress Updates:    Began to assess vestibular systems - pt appears to have minimal central gaze disturbances however is learning to compensate well with visual targeting. Pt will benefit from increased visual challenges, head movements with ambulation and functional tasks. During treatment pt performed sitting VOR exercises in horizontal and vertical planes, has difficulty gaining enough speed to produce symptoms. Standing balance activities with varied visual disturbances and VOR challenges. Gait x 100' without RW for challenging balance, min assist for generalized impaired balance. Gait with RW 2 x 150'  min-guard assist.   Second Session Skilled Therapeutic Interventions/Progress Updates:  Time:  [phone] Time Calculation (min): 58 min Pain: no c/o pain This vestibular PT decided against positional testing for BPPV secondary to status and area of pt's fractures. Did note Rt. torsional nystagmus with sit to sidelying Rt., of short duration. Pt beginning to be able to visually fixate and mask nystagmus quickly. Unable to treat positional vertigo secondary to lumbar and thoracic fractures. Recommend pt continue with gaze stabilization and visual targeting with mobility as compensatory mechanism. Pt will benefit from  outpatient vestibular follow-up.   Gait with RW 2 x 150' + cognitive task (adding cognitive task slows gait speed significantly and causes decreased balance), up to min assist needed for balance. Gait without RW x 100' with min assist particularly for turning as pt loses visual target. Step ups while focusing on visual targets 2 x 10 reps, step ups + x1 VOR exercises for visual challenge, min assist for balance. Horseshoes pick up from varied heights maintaining back precautions working on safety and scanning of environment.    Therapy Documentation Precautions:  Precautions Precautions: Back;Fall Type of Shoulder Precautions: AROM as tolerated; has a sling for comfort Required Braces or Orthoses: Spinal Brace Spinal Brace: Thoracolumbosacral orthotic;Applied in supine position Restrictions Weight Bearing Restrictions: No Other Position/Activity Restrictions: limited LT UE weight bearing Pain: Pain Assessment Pain Assessment: No/denies pain  See FIM for current functional status  Therapy/Group: Individual Therapy both sessions  Wilhemina Bonito 07/20/2012, 5:43 PM

## 2012-07-20 NOTE — Progress Notes (Signed)
Speech Language Pathology Daily Session Note  Patient Details  Name: RONDY KRUPINSKI MRN: [id number] Date of Birth: [birth year]  Today's Date: 07/20/2012 Time: 1015-1105 Time Calculation (min): 50 min  Short Term Goals: Week 1: SLP Short Term Goal 1 (Week 1): Pt will utilize compensatory strategies for self monitoring during high level functional problem solving task with supervision cues.  SLP Short Term Goal 2 (Week 1): Pt will demonstrate divided attention during high level problem solving task with supervision cues.  SLP Short Term Goal 3 (Week 1): Pt will verbalize anticipatory awareness of deficits for functional tasks with supervision cues.   Skilled Therapeutic Interventions: Treatment focus on cognitive goals. Pt demonstrated alternating attention between two moderately complex tasks with Mod I but required supervision verbal cues for complex organization with scheduling task. Pt required extra time to complete task but self-monitored and corrected errors with Mod I.    FIM:  Comprehension Comprehension Mode: Auditory Comprehension: 6-Follows complex conversation/direction: With extra time/assistive device Expression Expression Mode: Verbal Expression: 6-Expresses complex ideas: With extra time/assistive device Social Interaction Social Interaction: 6-Interacts appropriately with others with medication or extra time (anti-anxiety, antidepressant). Problem Solving Problem Solving: 5-Solves complex 90% of the time/cues < 10% of the time Memory Memory: 6-More than reasonable amt of time  Pain No/Denies Pain  Therapy/Group: Individual Therapy  Katherinne Mofield 07/20/2012, 3:29 PM

## 2012-07-20 NOTE — Plan of Care (Signed)
Problem: RH SKIN INTEGRITY Goal: RH STG MAINTAIN SKIN INTEGRITY WITH ASSISTANCE STG Maintain Skin Integrity With minimal Assistance.  Outcome: Not Progressing External hemorrhoids to rectum with moderate amount bright red blood after soap suds enema. A. Madisynn Plair,LPN

## 2012-07-20 NOTE — Progress Notes (Signed)
Patient ID: Rodney Riley, male   DOB: 18-Feb-1946, 66 y.o.   MRN: [id number]  Subjective/Complaints: Admitted 07/12/2012 after a fall from a ladder while cleaning his gutters when he fell approximately 10-12 feet without loss of consciousness. He complained of left shoulder pain and back pain  Would like pain meds for back pain and mild HA  Objective: Vital Signs: Blood pressure 170/68, pulse 102, temperature 98.4 F (36.9 C), temperature source Oral, resp. rate 21, height 5' 4.96" (1.65 m), weight 64.6 kg (142 lb 6.7 oz), SpO2 96.00%. Dg Thoracic Spine 2 View  07/18/2012  *RADIOLOGY REPORT*  Clinical Data: Recent fall, with compression fractures.  THORACIC SPINE - 2 VIEW  Comparison: 07/12/2012 CT  Findings: Fractures of T11, T12, L1, L2, better characterized on prior CT. No significant interval change. No appreciable retropulsion however evaluation for this by radiograph is limited. Atherosclerotic vascular calcification.  Status post median sternotomy and CABG.  Bibasilar opacities and small effusions suggested.  Partially imaged multiple posterior left rib fractures.  IMPRESSION: Fractures of T11-L2, better characterized on prior CT. No appreciable interval change.   Original Report Authenticated By: Jearld Lesch, M.D.    Dg Lumbar Spine 2-3 Views  07/18/2012  *RADIOLOGY REPORT*  Clinical Data: Recent fall, with compression fractures.  LUMBAR SPINE - 2-3 VIEW  Comparison: 07/12/2012 CT  Findings: Fractures of T11, T12, L1, L2, better characterized on prior CT. No significant interval change. No appreciable retropulsion however evaluation for this by radiograph is limited. Atherosclerotic vascular calcification.  Status post median sternotomy and CABG.  Bibasilar opacities and small effusions suggested.  Partially imaged multiple posterior left rib fractures.  IMPRESSION:  Fractures of T11-L2, better characterized on prior CT. No appreciable significant interval change.   Original Report  Authenticated By: Jearld Lesch, M.D.    Dg Abd 1 View  07/19/2012  *RADIOLOGY REPORT*  Clinical Data: Constipation  ABDOMEN - 1 VIEW  Comparison: None.  Findings: No evidence of dilated loops of large or small bowel. Small amounts stool in the rectum.  Gallstones in the gallbladder noted.  There is gas scattered throughout the large and small bowel. Posterior left rib fractures again noted.  IMPRESSION: 1. No evidence of bowel obstruction.  2. No significant stool burden to suggest constipation.  3. Cholelithiasis.  4. Rib fractures on the left.   Original Report Authenticated By: Genevive Bi, M.D.    Ct Shoulder Left Wo Contrast  07/18/2012  **ADDENDUM** CREATED: 07/18/2012 13:56:24  *RADIOLOGY REPORT*  3-DIMENSIONAL CT IMAGE RENDERING AT INDEPENDENT WORKSTATION:  Technique:  3-dimensional CT images were rendered by post- processing of the original CT data at independent workstation.  The 3-dimensional CT images were interpreted, and findings are reported in the accompanying complete CT report for this study.  3-D images were created and better demonstrate the relationship of the multiple scapular fracture fragments one another including the comminuted fracture of the superior aspect of the glenoid.  **END ADDENDUM** SIGNED BY: Allayne Gitelman. Jena Gauss, M.D.   07/18/2012  *RADIOLOGY REPORT*  Clinical Data: Left scapular fracture.  Multiple rib fractures.  CT OF THE LEFT SHOULDER WITHOUT CONTRAST  Technique:  Multidetector CT imaging was performed according to the standard protocol. Multiplanar CT image reconstructions were also generated.  Comparison: Radiographs dated 07/12/2012  Findings: There is a comminuted slightly impacted fracture of the undersurface of the distal clavicle.  The distal clavicle is also deformed secondary to a prior well healed fracture.  There is a fracture of the  base of the acromion with 7 mm of distraction.  There is also a fracture through the base of the coracoid with 6 mm of  distraction.  This fracture extends into the superior aspect of the glenoid.  The articular surface of the glenoid is fragmented and slightly impacted.  Linear fractures extend along the superior aspect of the scapula to the medial border.  Note is made of fractures of the left second through 6th ribs and left eighth rib.  There is a left pleural effusion with compressive atelectasis in the left lower lobe.  The proximal humerus is intact.  No dislocation.  IMPRESSION:  1.  Comminuted fracture of the scapula involving the bases of the coracoid and acromial processes as well as the superior aspect of the glenoid extending into the body of the scapula. 2.  Fracture of the distal clavicle. 3.  Multiple left rib fractures.   Original Report Authenticated By: Francene Boyers, M.D.    Results for orders placed during the hospital encounter of 07/18/12 (from the past 72 hour(s))  CBC WITH DIFFERENTIAL     Status: Abnormal   Collection Time   07/19/12  6:15 AM      Component Value Range Comment   WBC 8.6  4.0 - 10.5 K/uL    RBC 3.85 (*) 4.22 - 5.81 MIL/uL    Hemoglobin 11.8 (*) 13.0 - 17.0 g/dL    HCT 16.1 (*) 09.6 - 52.0 %    MCV 86.2  78.0 - 100.0 fL    MCH 30.6  26.0 - 34.0 pg    MCHC 35.5  30.0 - 36.0 g/dL    RDW 04.5  40.9 - 81.1 %    Platelets 328  150 - 400 K/uL    Neutrophils Relative 64  43 - 77 %    Neutro Abs 5.5  1.7 - 7.7 K/uL    Lymphocytes Relative 16  12 - 46 %    Lymphs Abs 1.4  0.7 - 4.0 K/uL    Monocytes Relative 16 (*) 3 - 12 %    Monocytes Absolute 1.4 (*) 0.1 - 1.0 K/uL    Eosinophils Relative 4  0 - 5 %    Eosinophils Absolute 0.3  0.0 - 0.7 K/uL    Basophils Relative 0  0 - 1 %    Basophils Absolute 0.0  0.0 - 0.1 K/uL   COMPREHENSIVE METABOLIC PANEL     Status: Abnormal   Collection Time   07/19/12  6:15 AM      Component Value Range Comment   Sodium 127 (*) 135 - 145 mEq/L    Potassium 4.0  3.5 - 5.1 mEq/L    Chloride 89 (*) 96 - 112 mEq/L    CO2 28  19 - 32 mEq/L     Glucose, Bld 133 (*) 70 - 99 mg/dL    BUN 17  6 - 23 mg/dL    Creatinine, Ser 9.14  0.50 - 1.35 mg/dL    Calcium 9.7  8.4 - 78.2 mg/dL    Total Protein 6.6  6.0 - 8.3 g/dL    Albumin 2.8 (*) 3.5 - 5.2 g/dL    AST 35  0 - 37 U/L    ALT 38  0 - 53 U/L    Alkaline Phosphatase 158 (*) 39 - 117 U/L    Total Bilirubin 0.7  0.3 - 1.2 mg/dL    GFR calc non Af Amer >90  >90 mL/min    GFR  calc Af Amer >90  >90 mL/min      HEENT: normal Cardio: RRR Resp: CTA B/L GI: BS positive Extremity:  Pulses positive Skin:   Intact Neuro: Anxious, Cranial Nerve II-XII normal, Normal Sensory and Abnormal Motor 5/5 RUE,4/5 L delt and tri, 5/5 grip, 4/5 in BLE Musc/Skel:  Other No pain with UE or LE AROM Oriented to person and place but not day and date   Assessment/Plan: 1. Functional deficits secondary to TBI, thoraco-lumbar fx's, left scapula fx  which require 3+ hours per day of interdisciplinary therapy in a comprehensive inpatient rehab setting. Physiatrist is providing close team supervision and 24 hour management of active medical problems listed below. Physiatrist and rehab team continue to assess barriers to discharge/monitor patient progress toward functional and medical goals. FIM: FIM - Bathing Bathing Steps Patient Completed: Chest;Right Arm;Left Arm;Abdomen Bathing: 2: Max-Patient completes 3-4 58f 10 parts or 25-49%  FIM - Upper Body Dressing/Undressing Upper body dressing/undressing steps patient completed: Thread/unthread right sleeve of pullover shirt/dresss;Put head through opening of pull over shirt/dress Upper body dressing/undressing: 2: Max-Patient completed 25-49% of tasks FIM - Lower Body Dressing/Undressing Lower body dressing/undressing: 1: Total-Patient completed less than 25% of tasks        FIM - Banker Devices: Orthosis Bed/Chair Transfer: 4: Supine > Sit: Min A (steadying Pt. > 75%/lift 1 leg);4: Sit > Supine: Min A  (steadying pt. > 75%/lift 1 leg);4: Bed > Chair or W/C: Min A (steadying Pt. > 75%);4: Chair or W/C > Bed: Min A (steadying Pt. > 75%)  FIM - Locomotion: Wheelchair Locomotion: Wheelchair: 1: Total Assistance/staff pushes wheelchair (Pt<25%) FIM - Locomotion: Ambulation Locomotion: Ambulation Assistive Devices: Designer, industrial/product Ambulation/Gait Assistance: 4: Min assist Locomotion: Ambulation: 2: Travels 50 - 149 ft with minimal assistance (Pt.>75%)  Comprehension Comprehension Mode: Auditory Comprehension: 7-Follows complex conversation/direction: With no assist  Expression Expression Mode: Verbal Expression: 7-Expresses complex ideas: With no assist  Social Interaction Social Interaction: 7-Interacts appropriately with others - No medications needed.  Problem Solving Problem Solving: 4-Solves basic 75 - 89% of the time/requires cueing 10 - 24% of the time  Memory Memory: 7-Complete Independence: No helper  Medical Problem List and Plan:  1. 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 Multiple thoracic lumbar fractures at T11, T12, L1 and L2 levels. TLSO brace when out of bed  3. Left scapular fracture. Conservative care with shoulder sling/nonoperative  4. Multiple left rib fractures. Conservative care  5. DVT Prophylaxis/Anticoagulation: SCDs. Monitor for signs of DVT  6. Pain Management: Hydrocodone and Robaxin as needed. Monitor mental status  7. Neuropsych: This patient is capable of making decisions on his/her own behalf.  8. Hypertension. Lisinopril 10 mg daily. Monitor with increased mobility  9. Coronary artery disease with history of bypass grafting. Will discuss on when aspirin 325 mg can be resumed  10. Hyperlipidemia. Lipitor 11.  Constipation with aggravation of hemmorhoids, maximize stool softeners   LOS (Days) 2 A FACE TO FACE EVALUATION WAS PERFORMED  Seif Teichert E 07/20/2012, 7:38 AM

## 2012-07-20 NOTE — Progress Notes (Signed)
Occupational Therapy Session Note  Patient Details  Name: DEANDRA ABEGGLEN MRN: [id number] Date of Birth: Jun 26, 1946  Today's Date: 07/20/2012 Time: 0900-1010 Time Calculation (min): 70 min  OT Short Term Goals Week 1:  OT Short Term Goal 1 (Week 1): Use gaze stabilitzation techniques for transfers to decrease nausea OT Short Term Goal 2 (Week 1): Pt will transfer to toilet/ BSC with min A OT Short Term Goal 3 (Week 1): Pt will perform toileting 3/3 steps with mod A OT Short Term Goal 4 (Week 1): Pt will roll in bed (side to side) with supervision for TLSO to be donned OT Short Term Goal 5 (Week 1): Pt will don shirt in supine position with mod A (rolling)  Skilled Therapeutic Interventions/Progress Updates:    Pt in bed upon arrival with wife at bedside.  Pt requested bathing at shower level.  Pt performed rolling side to side using correct technique adhering to back precautions.  Pt amb with RW to bathroom for toileting and bathing at shower level.  Pt issued long handle sponge but did not use this morning secondary to time limitations.  Pt completed dressing supine in bed with increased assistance from therapist again secondary to time limitations.  Pt reported dizziness ("swimmy headed") with transitional movements but independently incorporates gaze stabilization strategies.  Focus on adherence to back precautions, transitional movements, activity tolerance, functional ambulation, and standing balance.  Therapy Documentation Precautions:  Precautions Precautions: Back;Fall Type of Shoulder Precautions: AROM as tolerated; has a sling for comfort Required Braces or Orthoses: Spinal Brace Spinal Brace: Thoracolumbosacral orthotic;Applied in supine position Restrictions Other Position/Activity Restrictions: limited LT UE weight bearing General:   Vital Signs: Oxygen Therapy SpO2: 94 % O2 Device: None (Room air) FiO2 (%): 21 % Pain: Pain Assessment Pain Assessment: 0-10 Pain  Score:   4 Pain Type: Acute pain Pain Location: Back Pain Orientation: Lower Pain Descriptors: Aching Pain Frequency: Intermittent Pain Onset: Gradual Patients Stated Pain Goal: 3 Pain Intervention(s): Medication (See eMAR);Repositioned Multiple Pain Sites: No  See FIM for current functional status  Therapy/Group: Individual Therapy  Session 2 Time: 1345-1430 Pt c/o 2/10 pain in back; repostioned Individual Therapy Pt practiced walk-in shower transfers stepping over 4" step to sit on shower seat.  Pt side steps over step using wall to stabilize.  Pt practiced functional amb with RW for home mgmt tasks.  Pt continues to incorporate gaze stabilization strategies during ambulation and transitional movements.  Lavone Neri [facility] 07/20/2012, 11:01 AM

## 2012-07-21 ENCOUNTER — Inpatient Hospital Stay (HOSPITAL_COMMUNITY): Payer: Medicare Other | Admitting: Physical Therapy

## 2012-07-21 ENCOUNTER — Inpatient Hospital Stay (HOSPITAL_COMMUNITY): Payer: Medicare Other

## 2012-07-21 ENCOUNTER — Encounter (HOSPITAL_COMMUNITY): Payer: Medicare Other

## 2012-07-21 ENCOUNTER — Inpatient Hospital Stay (HOSPITAL_COMMUNITY): Payer: Medicare Other | Admitting: Speech Pathology

## 2012-07-21 DIAGNOSIS — S02109A Fracture of base of skull, unspecified side, initial encounter for closed fracture: Secondary | ICD-10-CM

## 2012-07-21 NOTE — Progress Notes (Signed)
Physical Therapy Session Note  Patient Details  Name: Rodney Riley MRN: [id number] Date of Birth: 03/31/1946  Today's Date: 07/21/2012 Time: 0730-0827 Time Calculation (min): 57 min  Short Term Goals: Week 1:  PT Short Term Goal 1 (Week 1): Pt to independently verbalize 3/3 back precautions and demonstrate compliance with the back precautions 100% of treatment session.   PT Short Term Goal 2 (Week 1): Pt to perform bed mobility with supervision PT Short Term Goal 3 (Week 1): Transfers supervision with RW PT Short Term Goal 4 (Week 1): Gait >150' with RW supervision PT Short Term Goal 5 (Week 1): Stairs with bil rails 10 with supervision  Skilled Therapeutic Interventions/Progress Updates:    Donned brace supine in bed, pt unable to contribute to attaching straps yet. Practiced visual targeting progressing to x1 VOR gaze stabilization while eating breakfast sitting EOB. Gait with RW 2 x 150' working on VOR gaze stabilization, visual targeting, saccades during ambulation, close supervision and no overt losses of balance however decreased gait speed noted with challenges. Gait without RW working on increased speed, close supervision without challenges. Min assist with added x1 VOR exercises during ambulation without device. Ball toss in standing with varied bases of support progressing to gait + ball toss without RW, min assist for intermittent losses of balance. 11 stairwell steps performed with min assist, one railing, and minimal available visual targets.   Pt making great progress.   Therapy Documentation Precautions:  Precautions Precautions: Back;Fall Type of Shoulder Precautions: AROM as tolerated; has a sling for comfort Required Braces or Orthoses: Spinal Brace Spinal Brace: Thoracolumbosacral orthotic;Applied in supine position Restrictions Weight Bearing Restrictions: No Other Position/Activity Restrictions: limited LT UE weight bearing Pain: Pain Assessment Pain  Assessment: 0-10 Pain Score:   3 Pain Type: Acute pain Pain Location: Rib cage Pain Orientation: Left Pain Descriptors: Aching Pain Onset: With Activity Pain Intervention(s): Repositioned  See FIM for current functional status  Therapy/Group: Individual Therapy  Wilhemina Bonito 07/21/2012, 12:54 PM

## 2012-07-21 NOTE — Progress Notes (Signed)
Occupational Therapy Session Note  Patient Details  Name: Rodney Riley MRN: [id number] Date of Birth: Nov 28, 1945  Today's Date: 07/21/2012  Session 1 Time: 0900-1000 Time Calculation (min): 60 min  Short Term Goals: Week 1:  OT Short Term Goal 1 (Week 1): Use gaze stabilitzation techniques for transfers to decrease nausea OT Short Term Goal 2 (Week 1): Pt will transfer to toilet/ BSC with min A OT Short Term Goal 3 (Week 1): Pt will perform toileting 3/3 steps with mod A OT Short Term Goal 4 (Week 1): Pt will roll in bed (side to side) with supervision for TLSO to be donned OT Short Term Goal 5 (Week 1): Pt will don shirt in supine position with mod A (rolling)  Skilled Therapeutic Interventions/Progress Updates:    Pt seated in w/c upon arrival.  Pt elected to complete bathing and dressing at sink level and bed level.  Pt stated that although shower was nice he wasn't able to wash trunk and back when seated in shower with TLSO donned.  Pt completed LB bathing and dressing with sit<>stand from w/c at sink.  Pt able to thread pants by crossing legs.  Pt transferred to bed for assistance with doffing TLSO to complete UB bathing and dressing.  Pt required assistance only with bathing back and doffing and donning TLSO.  Pt independent with incorporating gaze stabilization during transitional movements but continues to c/o dizziness and occasional nausea.  Focus on ADL retraining, safety awareness, compensatory strategies,, and activity tolerance.  Therapy Documentation Precautions:  Precautions Precautions: Back;Fall Type of Shoulder Precautions: AROM as tolerated; has a sling for comfort Required Braces or Orthoses: Spinal Brace Spinal Brace: Thoracolumbosacral orthotic;Applied in supine position Restrictions Weight Bearing Restrictions: No Other Position/Activity Restrictions: limited LT UE weight bearing  Pain: Pain Assessment Pain Assessment: 0-10 Pain Score:   2 Pain Type:  Acute pain Pain Location: Rib cage Pain Orientation: Left Pain Descriptors: Aching Pain Onset: With Activity Patients Stated Pain Goal:  (rolling in bed) Pain Intervention(s): Repositioned  See FIM for current functional status  Therapy/Group: Individual Therapy  Session 2 Time: 1300-1330 Pt c/o 3/10 pain in left rib cage; RN aware Individual Therapy Pt resting in bed with wife at bedside.  Focus of session of family education, specifically doffing/donning TLSO and changing pads.  Pt's wife observed doffing/donning TLSO and returned demonstrated changing pads.  Wife verbalized understanding.  Pt independent with directing care.   Lavone Neri [facility] 07/21/2012, 10:03 AM

## 2012-07-21 NOTE — Progress Notes (Signed)
Patient ID: Rodney Riley, male   DOB: 1945-10-11, 66 y.o.   MRN: [id number]  Subjective/Complaints: Admitted 07/12/2012 after a fall from a ladder while cleaning his gutters when he fell approximately 10-12 feet without loss of consciousness. He complained of left shoulder pain and back pain    Objective: Vital Signs: Blood pressure 154/77, pulse 93, temperature 97.8 F (36.6 C), temperature source Oral, resp. rate 19, height 5' 4.96" (1.65 m), weight 64.6 kg (142 lb 6.7 oz), SpO2 96.00%. Dg Abd 1 View  07/19/2012  *RADIOLOGY REPORT*  Clinical Data: Constipation  ABDOMEN - 1 VIEW  Comparison: None.  Findings: No evidence of dilated loops of large or small bowel. Small amounts stool in the rectum.  Gallstones in the gallbladder noted.  There is gas scattered throughout the large and small bowel. Posterior left rib fractures again noted.  IMPRESSION: 1. No evidence of bowel obstruction.  2. No significant stool burden to suggest constipation.  3. Cholelithiasis.  4. Rib fractures on the left.   Original Report Authenticated By: Genevive Bi, M.D.    Results for orders placed during the hospital encounter of 07/18/12 (from the past 72 hour(s))  CBC WITH DIFFERENTIAL     Status: Abnormal   Collection Time   07/19/12  6:15 AM      Component Value Range Comment   WBC 8.6  4.0 - 10.5 K/uL    RBC 3.85 (*) 4.22 - 5.81 MIL/uL    Hemoglobin 11.8 (*) 13.0 - 17.0 g/dL    HCT 46.9 (*) 62.9 - 52.0 %    MCV 86.2  78.0 - 100.0 fL    MCH 30.6  26.0 - 34.0 pg    MCHC 35.5  30.0 - 36.0 g/dL    RDW 52.8  41.3 - 24.4 %    Platelets 328  150 - 400 K/uL    Neutrophils Relative 64  43 - 77 %    Neutro Abs 5.5  1.7 - 7.7 K/uL    Lymphocytes Relative 16  12 - 46 %    Lymphs Abs 1.4  0.7 - 4.0 K/uL    Monocytes Relative 16 (*) 3 - 12 %    Monocytes Absolute 1.4 (*) 0.1 - 1.0 K/uL    Eosinophils Relative 4  0 - 5 %    Eosinophils Absolute 0.3  0.0 - 0.7 K/uL    Basophils Relative 0  0 - 1 %    Basophils  Absolute 0.0  0.0 - 0.1 K/uL   COMPREHENSIVE METABOLIC PANEL     Status: Abnormal   Collection Time   07/19/12  6:15 AM      Component Value Range Comment   Sodium 127 (*) 135 - 145 mEq/L    Potassium 4.0  3.5 - 5.1 mEq/L    Chloride 89 (*) 96 - 112 mEq/L    CO2 28  19 - 32 mEq/L    Glucose, Bld 133 (*) 70 - 99 mg/dL    BUN 17  6 - 23 mg/dL    Creatinine, Ser 0.10  0.50 - 1.35 mg/dL    Calcium 9.7  8.4 - 27.2 mg/dL    Total Protein 6.6  6.0 - 8.3 g/dL    Albumin 2.8 (*) 3.5 - 5.2 g/dL    AST 35  0 - 37 U/L    ALT 38  0 - 53 U/L    Alkaline Phosphatase 158 (*) 39 - 117 U/L    Total Bilirubin 0.7  0.3 - 1.2 mg/dL    GFR calc non Af Amer >90  >90 mL/min    GFR calc Af Amer >90  >90 mL/min      HEENT: normal Cardio: RRR Resp: CTA B/L GI: BS positive Extremity:  Pulses positive Skin:   Intact Neuro: Anxious, Cranial Nerve II-XII normal, Normal Sensory and Abnormal Motor 5/5 RUE,4/5 L delt and tri, 5/5 grip, 4/5 in BLE Musc/Skel:  Other No pain with UE or LE AROM Oriented to person and place but not day and date   Assessment/Plan: 1. Functional deficits secondary to TBI, thoraco-lumbar fx's, left scapula fx  which require 3+ hours per day of interdisciplinary therapy in a comprehensive inpatient rehab setting. Physiatrist is providing close team supervision and 24 hour management of active medical problems listed below. Physiatrist and rehab team continue to assess barriers to discharge/monitor patient progress toward functional and medical goals. FIM: FIM - Bathing Bathing Steps Patient Completed: Chest;Left Arm;Abdomen;Front perineal area;Right upper leg;Left upper leg Bathing: 3: Mod-Patient completes 5-7 23f 10 parts or 50-74%  FIM - Upper Body Dressing/Undressing Upper body dressing/undressing steps patient completed: Thread/unthread right sleeve of pullover shirt/dresss;Thread/unthread left sleeve of pullover shirt/dress Upper body dressing/undressing: 3: Mod-Patient  completed 50-74% of tasks FIM - Lower Body Dressing/Undressing Lower body dressing/undressing: 1: Total-Patient completed less than 25% of tasks  FIM - Toileting Toileting steps completed by patient: Performs perineal hygiene Toileting: 2: Max-Patient completed 1 of 3 steps  FIM - Diplomatic Services operational officer Devices: Walker;Elevated toilet seat Toilet Transfers: 4-To toilet/BSC: Min A (steadying Pt. > 75%);4-From toilet/BSC: Min A (steadying Pt. > 75%)  FIM - Bed/Chair Transfer Bed/Chair Transfer Assistive Devices: Therapist, occupational: 5: Supine > Sit: Supervision (verbal cues/safety issues);5: Sit > Supine: Supervision (verbal cues/safety issues);4: Bed > Chair or W/C: Min A (steadying Pt. > 75%);4: Chair or W/C > Bed: Min A (steadying Pt. > 75%)  FIM - Locomotion: Wheelchair Locomotion: Wheelchair: 0: Activity did not occur (ambulation primary means of locomotion) FIM - Locomotion: Ambulation Locomotion: Ambulation Assistive Devices: Designer, industrial/product Ambulation/Gait Assistance: 4: Min assist Locomotion: Ambulation: 4: Travels 150 ft or more with minimal assistance (Pt.>75%)  Comprehension Comprehension Mode: Auditory Comprehension: 6-Follows complex conversation/direction: With extra time/assistive device  Expression Expression Mode: Verbal Expression: 6-Expresses complex ideas: With extra time/assistive device  Social Interaction Social Interaction: 6-Interacts appropriately with others with medication or extra time (anti-anxiety, antidepressant).  Problem Solving Problem Solving: 5-Solves complex 90% of the time/cues < 10% of the time  Memory Memory: 6-More than reasonable amt of time  Medical Problem List and Plan:  1. 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 Multiple thoracic lumbar fractures at T11, T12, L1 and L2 levels. TLSO brace when out of bed  3. Left scapular fracture. Conservative care with shoulder  sling/nonoperative  4. Multiple left rib fractures. Conservative care  5. DVT Prophylaxis/Anticoagulation: SCDs. Monitor for signs of DVT  6. Pain Management: Hydrocodone and Robaxin as needed. Monitor mental status  7. Neuropsych: This patient is capable of making decisions on his/her own behalf.  8. Hypertension. Lisinopril 10 mg daily. Monitor with increased mobility  9. Coronary artery disease with history of bypass grafting. Will discuss on when aspirin 325 mg can be resumed  10. Hyperlipidemia. Lipitor 11.  Constipation with aggravation of hemmorhoids, maximize stool softeners 12.  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

## 2012-07-21 NOTE — Progress Notes (Signed)
Physical Therapy Session Note  Patient Details  Name: Rodney Riley MRN: [id number] Date of Birth: Nov 11, 1945  Today's Date: 07/21/2012 Time: 1660-6301 Time Calculation (min): 58 min  Short Term Goals: Week 1:  PT Short Term Goal 1 (Week 1): Pt to independently verbalize 3/3 back precautions and demonstrate compliance with the back precautions 100% of treatment session.   PT Short Term Goal 2 (Week 1): Pt to perform bed mobility with supervision PT Short Term Goal 3 (Week 1): Transfers supervision with RW PT Short Term Goal 4 (Week 1): Gait >150' with RW supervision PT Short Term Goal 5 (Week 1): Stairs with bil rails 10 with supervision  Skilled Therapeutic Interventions/Progress Updates:    Pt participated in entire session with out RW for balance challenges (however pt will likely still need RW at D/C for safety secondary to impaired balance). Gait x 300', 150', 100' without device, up to min assist while traveling over carpeted and tile surfaces varying directions, head movements, visual scanning/saccades. Balance activities challenging visual and somatosensory systems standing on compliant surfaces with varied bases of support with eventual addition of ball toss. Pt initially needing constant min assist progressing to intermittent. Obstacle course weaving through cones and stepping over obstacle (most difficulty and most symptom provoking).   Pt has very "blocked" neck secondary to fear of losing visual targets, educated on simple stretches and relaxation techniques to prevent muscular neck pain (pt reports he is starting to get tight).   Therapy Documentation Precautions:  Precautions Precautions: Back;Fall Type of Shoulder Precautions: AROM as tolerated; has a sling for comfort Required Braces or Orthoses: Spinal Brace Spinal Brace: Thoracolumbosacral orthotic;Applied in supine position Restrictions Weight Bearing Restrictions: No Other Position/Activity Restrictions: limited  LT UE weight bearing Pain: Pain Assessment Pain Assessment: No/denies pain  See FIM for current functional status  Therapy/Group: Individual Therapy  Wilhemina Bonito 07/21/2012, 5:38 PM

## 2012-07-21 NOTE — Progress Notes (Signed)
Social Work  Social Work Assessment and Plan  Patient Details  Name: Rodney Riley MRN: [id number] Date of Birth: [birth year]  Today's Date: 07/21/2012  Problem List:  Patient Active Problem List  Diagnosis  . Left scapula fracture  . Fall  . Multiple fractures of ribs of left side  . Traumatic subdural hematoma  . Traumatic subarachnoid hemorrhage  . T11 vertebral fracture  . T12 vertebral fracture  . L1 vertebral fracture  . L2 vertebral fracture  . Trauma   Past Medical History:  Past Medical History  Diagnosis Date  . Coronary artery disease   . Hypertension   . Low back pain   . Hyperlipidemia    Past Surgical History:  Past Surgical History  Procedure Date  . Cardiac surgery   . Lumbar disc surgery    Social History:  reports that he has never smoked. He does not have any smokeless tobacco history on file. His alcohol and drug histories not on file.  Family / Support Systems Marital Status: Married Patient Roles: Spouse Spouse/Significant Other: wife, Arian Murley @ [phone] Children: two adult daughters living  in [place] and [place] Other Supports: Very supportive friends and church members Anticipated Caregiver: wife, Dewayne Hatch Ability/Limitations of Caregiver: S-Min A Caregiver Availability: 24/7 Family Dynamics: Wife is at hospital daily and remains extremely supporitve - very willing to assist as much as needed.  Social History Preferred language: English Religion: Presbyterian Cultural Background: NA Education: PhD Read: Yes Write: Yes Employment Status: Retired Fish farm manager Issues: none Guardian/Conservator: none   Abuse/Neglect Physical Abuse: Denies Verbal Abuse: Denies Sexual Abuse: Denies Exploitation of patient/patient's resources: Denies Self-Neglect: Denies  Emotional Status Pt's affect, behavior adn adjustment status: Pt very pleasant, talkative and fully oriented.  Able to very specific conversation about  cognitive deficits he feels he is experiencing related to his TBI.  Denies any significant emotional distress, however, does note that he feels his stay on CIR has educated him about his TBI and possible symptoms resulting and that this has helped him deal emotionally.  Will be seen by neuropsychology per his TBI diagnosis. Recent Psychosocial Issues: none Pyschiatric History: none Substance Abuse History: none  Patient / Family Perceptions, Expectations & Goals Pt/Family understanding of illness & functional limitations: Pt and wife with good understanding of multiple injuries suffered in fall and of the extent of his brain injury.  Both very eager to receive education regarding these injuries as well.   Premorbid pt/family roles/activities: Very active retired couple who are involved with many community programs.  Anticipated changes in roles/activities/participation: Pt will be physically limited by brace for a few months.  Hopes to be able to resume active lifestyle eventually and "...get back to things we used to do..." Pt/family expectations/goals: "I just hope to get back to doing the things we love."  [employer]: None Premorbid Home Care/DME Agencies: None Transportation available at discharge: yes Resource referrals recommended: Neuropsychology;Support group (specify)  Discharge Planning Living Arrangements: Spouse/significant other Support Systems: Spouse/significant other;Children;Friends/neighbors;Church/faith community Type of Residence: Private residence Insurance Resources: [employer] Financial Resources: Social Security Financial Screen Referred: No Living Expenses: Own Money Management: Patient;Spouse Do you have any problems obtaining your medications?: No Home Management: pt and wife Patient/Family Preliminary Plans: pt to return home with his wife who is able and very willing to provide 24/7 supervision/ assistance Social Work Anticipated  Follow Up Needs: HH/OP;Support Group Expected length of stay: 7-10 days  Clinical Impression Very pleasant gentleman here  after a fall from a ladder and suffering multiple injuries including a TBI.  Recovering well cognitively.  Very motivated for CIR therapies and wife very supportive and able to provide any assistance needed.  Anticipate short LOS.  Laycee Fitzsimmons 07/21/2012, 3:32 PM

## 2012-07-22 ENCOUNTER — Inpatient Hospital Stay (HOSPITAL_COMMUNITY): Payer: Medicare Other | Admitting: Physical Therapy

## 2012-07-22 NOTE — Progress Notes (Signed)
Patient ID: Rodney Riley, male   DOB: [birth year], 66 y.o.   MRN: [id number] Patient ID: Rodney Riley, male   DOB: 16-Aug-1946, 66 y.o.   MRN: [id number]  Subjective/Complaints:  11/9. Comfortable night and has no complaints this morning.  Status post TBI/multiple trauma. ENT unremarkable. Chest clear. Cardiovascular normal heart sounds no murmur. Abdomen soft flat nontender. Extremities no edema  BP Readings from Last 3 Encounters:  07/22/12 145/86  07/18/12 144/57    Admitted 07/12/2012 after a fall from a ladder while cleaning his gutters when he fell approximately 10-12 feet without loss of consciousness. He complained of left shoulder pain and back pain    Objective: Vital Signs: Blood pressure 145/86, pulse 87, temperature 98.6 F (37 C), temperature source Oral, resp. rate 18, height 5' 4.96" (1.65 m), weight 64.6 kg (142 lb 6.7 oz), SpO2 96.00%. No results found. No results found for this or any previous visit (from the past 72 hour(s)).   HEENT: normal Cardio: RRR Resp: CTA B/L GI: BS positive Extremity:  Pulses positive Skin:   Intact Neuro: Anxious, Cranial Nerve II-XII normal, Normal Sensory and Abnormal Motor 5/5 RUE,4/5 L delt and tri, 5/5 grip, 4/5 in BLE Musc/Skel:  Other No pain with UE or LE AROM Oriented to person and place but not day and date   Assessment/Plan: 1. Functional deficits secondary to TBI, thoraco-lumbar fx's, left scapula fx  which require 3+ hours per day of interdisciplinary therapy in a comprehensive inpatient rehab setting. Physiatrist is providing close team supervision and 24 hour management of active medical problems listed below. Physiatrist and rehab team continue to assess barriers to discharge/monitor patient progress toward functional and medical goals. FIM: FIM - Bathing Bathing Steps Patient Completed: Chest;Right Arm;Left Arm;Abdomen;Front perineal area;Buttocks;Right upper leg;Left upper leg Bathing: 4: Min-Patient  completes 8-9 74f 10 parts or 75+ percent  FIM - Upper Body Dressing/Undressing Upper body dressing/undressing steps patient completed: Thread/unthread right sleeve of pullover shirt/dresss;Thread/unthread left sleeve of pullover shirt/dress;Put head through opening of pull over shirt/dress Upper body dressing/undressing: 4: Min-Patient completed 75 plus % of tasks FIM - Lower Body Dressing/Undressing Lower body dressing/undressing steps patient completed: Thread/unthread right underwear leg;Thread/unthread left underwear leg;Pull underwear up/down;Pull pants up/down;Thread/unthread left pants leg;Thread/unthread right pants leg Lower body dressing/undressing: 4: Min-Patient completed 75 plus % of tasks  FIM - Toileting Toileting steps completed by patient: Performs perineal hygiene Toileting: 2: Max-Patient completed 1 of 3 steps  FIM - Diplomatic Services operational officer Devices: Walker;Elevated toilet seat Toilet Transfers: 4-To toilet/BSC: Min A (steadying Pt. > 75%);4-From toilet/BSC: Min A (steadying Pt. > 75%)  FIM - Bed/Chair Transfer Bed/Chair Transfer Assistive Devices: Therapist, occupational: 4: Bed > Chair or W/C: Min A (steadying Pt. > 75%);4: Chair or W/C > Bed: Min A (steadying Pt. > 75%)  FIM - Locomotion: Wheelchair Locomotion: Wheelchair: 0: Activity did not occur (ambulation primary means of locomotion) FIM - Locomotion: Ambulation Locomotion: Ambulation Assistive Devices:  (no device) Ambulation/Gait Assistance: 4: Min assist Locomotion: Ambulation: 4: Travels 150 ft or more with minimal assistance (Pt.>75%)  Comprehension Comprehension Mode: Auditory Comprehension: 6-Follows complex conversation/direction: With extra time/assistive device  Expression Expression Mode: Verbal Expression: 6-Expresses complex ideas: With extra time/assistive device  Social Interaction Social Interaction: 6-Interacts appropriately with others with medication or extra  time (anti-anxiety, antidepressant).  Problem Solving Problem Solving: 6-Solves complex problems: With extra time  Memory Memory: 6-More than reasonable amt of time  Medical Problem List and Plan:  1.  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 Multiple thoracic lumbar fractures at T11, T12, L1 and L2 levels. TLSO brace when out of bed  3. Left scapular fracture. Conservative care with shoulder sling/nonoperative  4. Multiple left rib fractures. Conservative care  5. DVT Prophylaxis/Anticoagulation: SCDs. Monitor for signs of DVT  6. Pain Management: Hydrocodone and Robaxin as needed. Monitor mental status  7. Neuropsych: This patient is capable of making decisions on his/her own behalf.  8. Hypertension. Lisinopril 10 mg daily. Monitor with increased mobility  9. Coronary artery disease with history of bypass grafting. Will discuss on when aspirin 325 mg can be resumed  10. Hyperlipidemia. Lipitor 11.  Constipation with aggravation of hemmorhoids, maximize stool softeners 12.  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

## 2012-07-22 NOTE — Consult Note (Signed)
07/21/12  NEUROCOGNITIVE TESTING - CONFIDENTIAL [facility]   [name] is a 66 year old, married Caucasian male with 20 years of education, who reported experiencing mild cognitive difficulties following a brain injury he sustained after falling off a ladder.  His chief complaint was slowed processing speed. He was referred for neuropsychological consultation given the possibility of cognitive sequelae subsequent to his current medical status and in order to assist in treatment planning.   Upon questioning, [name] denied experiencing any retrograde amnesia or posttraumatic amnesia. He said he may have lost consciousness for a few seconds following the accident but he was quite dazed for about 5 minutes. According to medical records, the most recent head CT scan performed reportedly revealed a slightly increased subarachnoid hemorrhage, as well as increased edema in the posterior parietal lobes without midline shift.  PROCEDURES: [3 units of 16109 on 07/21/12]  Diagnostic Interview Medical record review Behavioral observations  The following tests were performed during today's visit: Repeatable Battery for the Assessment of Neuropsychological Status (RBANS, form B), and the Geriatric Depression Scale (short form).  Test results are as follows:    RBANS Indices Scaled Score Percentile Description  Immediate Memory  106 66 Average  Visuospatial/Constructional 112 79 Above average  Language 101 53 Average  Attention 82 12 Below average  Delayed Memory 110 75 Average  Total Score 102 55 Average    RBANS Subtests Raw Score Percentile Description  List Learning 29 58 Average  Story Memory 20 68 Average  Figure Copy 18 45 Average  Line Orientation 20 87 Above average  Picture Naming 10 73 Average  Semantic Fluency 22 58 Average  Digit Span 8 14 Below average  Coding 38 14 Below average  List Recall 7 68 Average  List Recognition 20 70 Average    Story Recall 9 45 Average  Figure recall 17 81 Above average    Geriatric Depression Scale (short form) Raw Score = 1/15 Description = WNL   Cognitive Evaluation: Test results revealed intact (if not above average) functioning in most cognitive domains and thinking skills assessed during this evaluation with the exception of mildly reduced attention and processing speed.   Emotional & Behavioral Evaluation: [name] was appropriately dressed for season and situation, and he appeared tidy and well-groomed. He was wearing a brace around his abdomen and chest to help with support. He was friendly and rapport easily established. His speech was as expected and he was able to express ideas effectively. He seemed to understand test directions readily. His affect was appropriately modulated and euthymic. Attention and motivation were good. Optimal test taking conditions were maintained.   From an emotional standpoint, on a brief self report inventory, [name] did not endorse experiencing any significant signs of depression. Suicidal/homicidal ideation, plan or intent was denied. No manic or hypomanic episodes were reported. The patient denied ever experiencing any auditory/visual hallucinations. No major behavioral or personality changes were endorsed.    Overall, [name] exhibited generally intact cognitive functioning with the exception mildly reduced attention and processing speed. There were no major signs of clinical psychopathology.   In light of these findings, the following recommendations are provided.    RECOMMENDATIONS  Recommendations for treatment team:    [name] requires more time than typical to process information. Thus, the treatment team may benefit from waiting for a verbal response to from him before presenting additional information.    To the extent possible, multitasking should be avoided.  Performance will generally be best in a structured, routine, and familiar  environment, as opposed to situations involving complex problems.   Recommendations for discharge planning:    Complete a comprehensive neuropsychological evaluation as an outpatient in 8-12 months to assess for interval change. Information about this was provided to the patient and his wife.    Establish long-term follow-up care with a provider knowledgeable in head injury.    Maintain engagement in mentally, physically and cognitively stimulating activities.    Strive to maintain a healthy lifestyle (e.g., proper diet and exercise) in order to promote physical, cognitive and emotional health.   REFERRING DIAGNOSIS: Traumatic brain injury  FINAL DIAGNOSIS:  Traumatic brain injury   Debbe Mounts, Psy.D.  Clinical Neuropsychologist

## 2012-07-22 NOTE — Progress Notes (Signed)
Physical Therapy Note  Patient Details  Name: MYKEAL CARRICK MRN: [id number] Date of Birth: May 20, 1946 Today's Date: 07/22/2012  1100-1155 (55 minutes) individual Pain : no reported pain  Precautions: back, TLSO donned in supine  Focus of treatment: gait training with /without AD / steps; therapeutic exercises for general activity tolerance; therapeutic activity focused on dynamic standing balance/protective balance reactions Treatment: Gait 120 feet RW SBA; gait 120 feet without AD min to SBA with head turns and changing  Cadence; Biodex Limits of stability increasing skill level required, weight shifts without UE assist; Nustep Level 3 X 10 minutes LEs only; up /down 12 steps with rail right close SBA with vcs for step to step .    Vedanshi Massaro,JIM 07/22/2012, 7:43 AM

## 2012-07-23 ENCOUNTER — Inpatient Hospital Stay (HOSPITAL_COMMUNITY): Payer: Medicare Other | Admitting: Physical Therapy

## 2012-07-23 MED ORDER — DIPHENHYDRAMINE HCL 25 MG PO CAPS
25.0000 mg | ORAL_CAPSULE | Freq: Every evening | ORAL | Status: DC | PRN
  Administered 2012-07-24: 25 mg via ORAL
  Filled 2012-07-23: qty 1

## 2012-07-23 NOTE — Progress Notes (Signed)
Physical Therapy Note  Patient Details  Name: MARKIS HOWERY MRN: [id number] Date of Birth: [birth year] Today's Date: 07/23/2012  0900-0940 (40 minutes) individual Pain: no complaint of pain Focus of treatment: Therapeutic activities focused on maintaining dynamic standing balance without AD; gait training/endurance with /without AD Treatment: Gait with SPC 120 feet X 2 close SBA with minimal stagger/ no loss of balance; Nustep Level 4 X 10 minutes for general activity tolerance; standing alternate stepping to 8 inch step, stepping over obstacle , gait forward/backward , sideways without AD.    Batu Cassin,JIM 07/23/2012, 7:39 AM

## 2012-07-23 NOTE — Progress Notes (Signed)
Patient ID: NICOLIS HEASTER, male   DOB: 04-May-1946, 66 y.o.   MRN: [id number] Patient ID: LORELL SMERIGLIO, male   DOB: Mar 11, 1946, 66 y.o.   MRN: [id number] Patient ID: AREG CHEY, male   DOB: 1946-06-01, 66 y.o.   MRN: [id number]  Subjective/Complaints:  11/10.  Status post TBI/multiple trauma.  Asymptomatic without complaints ENT unremarkable. Chest clear. Cardiovascular normal heart sounds no murmur. Abdomen soft flat nontender. Extremities no edema  BP Readings from Last 3 Encounters:  07/23/12 152/78  07/18/12 144/57    Admitted 07/12/2012 after a fall from a ladder while cleaning his gutters when he fell approximately 10-12 feet without loss of consciousness. He complained of left shoulder pain and back pain    Objective: Vital Signs: Blood pressure 152/78, pulse 80, temperature 98.4 F (36.9 C), temperature source Oral, resp. rate 17, height 5' 4.96" (1.65 m), weight 64.6 kg (142 lb 6.7 oz), SpO2 96.00%. No results found. No results found for this or any previous visit (from the past 72 hour(s)).   HEENT: normal Cardio: RRR Resp: CTA B/L GI: BS positive Extremity:  Pulses positive Skin:   Intact Neuro: Anxious, Cranial Nerve II-XII normal, Normal Sensory and Abnormal Motor 5/5 RUE,4/5 L delt and tri, 5/5 grip, 4/5 in BLE Musc/Skel:  Other No pain with UE or LE AROM Oriented to person and place but not day and date   Assessment/Plan: 1. Functional deficits secondary to TBI, thoraco-lumbar fx's, left scapula fx  which require 3+ hours per day of interdisciplinary therapy in a comprehensive inpatient rehab setting. Physiatrist is providing close team supervision and 24 hour management of active medical problems listed below. Physiatrist and rehab team continue to assess barriers to discharge/monitor patient progress toward functional and medical goals. FIM: FIM - Bathing Bathing Steps Patient Completed: Chest;Right Arm;Left Arm;Abdomen;Front perineal  area;Buttocks;Right upper leg;Left upper leg Bathing: 4: Min-Patient completes 8-9 33f 10 parts or 75+ percent  FIM - Upper Body Dressing/Undressing Upper body dressing/undressing steps patient completed: Thread/unthread right sleeve of pullover shirt/dresss;Thread/unthread left sleeve of pullover shirt/dress;Put head through opening of pull over shirt/dress Upper body dressing/undressing: 4: Min-Patient completed 75 plus % of tasks FIM - Lower Body Dressing/Undressing Lower body dressing/undressing steps patient completed: Thread/unthread right underwear leg;Thread/unthread left underwear leg;Pull underwear up/down;Pull pants up/down;Thread/unthread left pants leg;Thread/unthread right pants leg Lower body dressing/undressing: 2: Max-Patient completed 25-49% of tasks  FIM - Toileting Toileting steps completed by patient: Performs perineal hygiene Toileting: 2: Max-Patient completed 1 of 3 steps  FIM - Diplomatic Services operational officer Devices: Walker;Elevated toilet seat Toilet Transfers: 4-To toilet/BSC: Min A (steadying Pt. > 75%);4-From toilet/BSC: Min A (steadying Pt. > 75%)  FIM - Bed/Chair Transfer Bed/Chair Transfer Assistive Devices: Therapist, occupational: 4: Bed > Chair or W/C: Min A (steadying Pt. > 75%);4: Chair or W/C > Bed: Min A (steadying Pt. > 75%)  FIM - Locomotion: Wheelchair Locomotion: Wheelchair: 0: Activity did not occur (ambulation primary means of locomotion) FIM - Locomotion: Ambulation Locomotion: Ambulation Assistive Devices:  (no device) Ambulation/Gait Assistance: 4: Min assist Locomotion: Ambulation: 4: Travels 150 ft or more with minimal assistance (Pt.>75%)  Comprehension Comprehension Mode: Auditory Comprehension: 6-Follows complex conversation/direction: With extra time/assistive device  Expression Expression Mode: Verbal Expression: 6-Expresses complex ideas: With extra time/assistive device  Social Interaction Social Interaction:  6-Interacts appropriately with others with medication or extra time (anti-anxiety, antidepressant).  Problem Solving Problem Solving: 6-Solves complex problems: With extra time  Memory Memory: 6-More than reasonable amt  of time  Medical Problem List and Plan:  1. 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 Multiple thoracic lumbar fractures at T11, T12, L1 and L2 levels. TLSO brace when out of bed  3. Left scapular fracture. Conservative care with shoulder sling/nonoperative  4. Multiple left rib fractures. Conservative care  5. DVT Prophylaxis/Anticoagulation: SCDs. Monitor for signs of DVT  6. Pain Management: Hydrocodone and Robaxin as needed. Monitor mental status  7. Neuropsych: This patient is capable of making decisions on his/her own behalf.  8. Hypertension. Lisinopril 10 mg daily. Monitor with increased mobility  9. Coronary artery disease with history of bypass grafting. Will discuss on when aspirin 325 mg can be resumed  10. Hyperlipidemia. Lipitor 11.  Constipation with aggravation of hemmorhoids, maximize stool softeners 12.  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

## 2012-07-24 ENCOUNTER — Encounter (HOSPITAL_COMMUNITY): Payer: Self-pay | Admitting: *Deleted

## 2012-07-24 ENCOUNTER — Inpatient Hospital Stay (HOSPITAL_COMMUNITY): Payer: Medicare Other | Admitting: Speech Pathology

## 2012-07-24 ENCOUNTER — Inpatient Hospital Stay (HOSPITAL_COMMUNITY): Payer: Medicare Other | Admitting: Physical Therapy

## 2012-07-24 ENCOUNTER — Inpatient Hospital Stay (HOSPITAL_COMMUNITY): Payer: Medicare Other

## 2012-07-24 ENCOUNTER — Encounter (HOSPITAL_COMMUNITY): Payer: Medicare Other

## 2012-07-24 LAB — BASIC METABOLIC PANEL
BUN: 14 mg/dL (ref 6–23)
CO2: 25 mEq/L (ref 19–32)
Calcium: 9.9 mg/dL (ref 8.4–10.5)
Chloride: 88 mEq/L — ABNORMAL LOW (ref 96–112)
Creatinine, Ser: 0.51 mg/dL (ref 0.50–1.35)
GFR calc Af Amer: >90 mL/min (ref >90)
GFR calc non Af Amer: >90 mL/min (ref >90)
Glucose, Bld: 144 mg/dL — ABNORMAL HIGH (ref 70–99)
Potassium: 4.2 mEq/L (ref 3.5–5.1)
Sodium: 127 mEq/L — ABNORMAL LOW (ref 135–145)

## 2012-07-24 NOTE — Progress Notes (Signed)
Patient ID: Rodney Riley, male   DOB: July 05, 1946, 66 y.o.   MRN: [id number]   Subjective/Complaints: Admitted 07/12/2012 after a fall from a ladder while cleaning his gutters when he fell approximately 10-12 feet without loss of consciousness. He complained of left shoulder pain and back pain    Objective: Vital Signs: Blood pressure 154/77, pulse 93, temperature 97.8 F (36.6 C), temperature source Oral, resp. rate 19, height 5' 4.96" (1.65 m), weight 64.6 kg (142 lb 6.7 oz), SpO2 96.00%. Dg Abd 1 View  07/19/2012  *RADIOLOGY REPORT*  Clinical Data: Constipation  ABDOMEN - 1 VIEW  Comparison: None.  Findings: No evidence of dilated loops of large or small bowel. Small amounts stool in the rectum.  Gallstones in the gallbladder noted.  There is gas scattered throughout the large and small bowel. Posterior left rib fractures again noted.  IMPRESSION: 1. No evidence of bowel obstruction.  2. No significant stool burden to suggest constipation.  3. Cholelithiasis.  4. Rib fractures on the left.   Original Report Authenticated By: Genevive Bi, M.D.    Results for orders placed during the hospital encounter of 07/18/12 (from the past 72 hour(s))  CBC WITH DIFFERENTIAL     Status: Abnormal   Collection Time   07/19/12  6:15 AM      Component Value Range Comment   WBC 8.6  4.0 - 10.5 K/uL    RBC 3.85 (*) 4.22 - 5.81 MIL/uL    Hemoglobin 11.8 (*) 13.0 - 17.0 g/dL    HCT 56.2 (*) 13.0 - 52.0 %    MCV 86.2  78.0 - 100.0 fL    MCH 30.6  26.0 - 34.0 pg    MCHC 35.5  30.0 - 36.0 g/dL    RDW 86.5  78.4 - 69.6 %    Platelets 328  150 - 400 K/uL    Neutrophils Relative 64  43 - 77 %    Neutro Abs 5.5  1.7 - 7.7 K/uL    Lymphocytes Relative 16  12 - 46 %    Lymphs Abs 1.4  0.7 - 4.0 K/uL    Monocytes Relative 16 (*) 3 - 12 %    Monocytes Absolute 1.4 (*) 0.1 - 1.0 K/uL    Eosinophils Relative 4  0 - 5 %    Eosinophils Absolute 0.3  0.0 - 0.7 K/uL    Basophils Relative 0  0 - 1 %    Basophils Absolute 0.0  0.0 - 0.1 K/uL   COMPREHENSIVE METABOLIC PANEL     Status: Abnormal   Collection Time   07/19/12  6:15 AM      Component Value Range Comment   Sodium 127 (*) 135 - 145 mEq/L    Potassium 4.0  3.5 - 5.1 mEq/L    Chloride 89 (*) 96 - 112 mEq/L    CO2 28  19 - 32 mEq/L    Glucose, Bld 133 (*) 70 - 99 mg/dL    BUN 17  6 - 23 mg/dL    Creatinine, Ser 2.95  0.50 - 1.35 mg/dL    Calcium 9.7  8.4 - 28.4 mg/dL    Total Protein 6.6  6.0 - 8.3 g/dL    Albumin 2.8 (*) 3.5 - 5.2 g/dL    AST 35  0 - 37 U/L    ALT 38  0 - 53 U/L    Alkaline Phosphatase 158 (*) 39 - 117 U/L    Total Bilirubin 0.7  0.3 - 1.2 mg/dL    GFR calc non Af Amer >90  >90 mL/min    GFR calc Af Amer >90  >90 mL/min      HEENT: normal Cardio: RRR Resp: CTA B/L GI: BS positive Extremity:  Pulses positive Skin:   Intact Neuro: Anxious, Cranial Nerve II-XII normal, Normal Sensory and Abnormal Motor 5/5 RUE,4/5 L delt and tri, 5/5 grip, 4/5 in BLE Musc/Skel:  Other No pain with UE or LE AROM Oriented to person and place but not day and date   Assessment/Plan: 1. Functional deficits secondary to TBI, thoraco-lumbar fx's, left scapula fx  which require 3+ hours per day of interdisciplinary therapy in a comprehensive inpatient rehab setting. Physiatrist is providing close team supervision and 24 hour management of active medical problems listed below. Physiatrist and rehab team continue to assess barriers to discharge/monitor patient progress toward functional and medical goals. Should be medically stable for D/C recheck BMET to monitor Na FIM: FIM - Bathing Bathing Steps Patient Completed: Chest;Left Arm;Abdomen;Front perineal area;Right upper leg;Left upper leg Bathing: 3: Mod-Patient completes 5-7 36f 10 parts or 50-74%  FIM - Upper Body Dressing/Undressing Upper body dressing/undressing steps patient completed: Thread/unthread right sleeve of pullover shirt/dresss;Thread/unthread left sleeve of  pullover shirt/dress Upper body dressing/undressing: 3: Mod-Patient completed 50-74% of tasks FIM - Lower Body Dressing/Undressing Lower body dressing/undressing: 1: Total-Patient completed less than 25% of tasks  FIM - Toileting Toileting steps completed by patient: Performs perineal hygiene Toileting: 2: Max-Patient completed 1 of 3 steps  FIM - Diplomatic Services operational officer Devices: Walker;Elevated toilet seat Toilet Transfers: 4-To toilet/BSC: Min A (steadying Pt. > 75%);4-From toilet/BSC: Min A (steadying Pt. > 75%)  FIM - Bed/Chair Transfer Bed/Chair Transfer Assistive Devices: Therapist, occupational: 5: Supine > Sit: Supervision (verbal cues/safety issues);5: Sit > Supine: Supervision (verbal cues/safety issues);4: Bed > Chair or W/C: Min A (steadying Pt. > 75%);4: Chair or W/C > Bed: Min A (steadying Pt. > 75%)  FIM - Locomotion: Wheelchair Locomotion: Wheelchair: 0: Activity did not occur (ambulation primary means of locomotion) FIM - Locomotion: Ambulation Locomotion: Ambulation Assistive Devices: Designer, industrial/product Ambulation/Gait Assistance: 4: Min assist Locomotion: Ambulation: 4: Travels 150 ft or more with minimal assistance (Pt.>75%)  Comprehension Comprehension Mode: Auditory Comprehension: 6-Follows complex conversation/direction: With extra time/assistive device  Expression Expression Mode: Verbal Expression: 6-Expresses complex ideas: With extra time/assistive device  Social Interaction Social Interaction: 6-Interacts appropriately with others with medication or extra time (anti-anxiety, antidepressant).  Problem Solving Problem Solving: 5-Solves complex 90% of the time/cues < 10% of the time  Memory Memory: 6-More than reasonable amt of time  Medical Problem List and Plan:  1. 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 Multiple thoracic lumbar fractures at T11, T12, L1 and L2 levels. TLSO brace when out of bed    3. Left scapular fracture. Conservative care with shoulder sling/nonoperative  4. Multiple left rib fractures. Conservative care  5. DVT Prophylaxis/Anticoagulation: SCDs. Monitor for signs of DVT  6. Pain Management: Hydrocodone and Robaxin as needed. Monitor mental status  7. Neuropsych: This patient is capable of making decisions on his/her own behalf.  8. Hypertension. Lisinopril 10 mg daily. Monitor with increased mobility  9. Coronary artery disease with history of bypass grafting. Will discuss on when aspirin 325 mg can be resumed  10. Hyperlipidemia. Lipitor 11.  Constipation with aggravation of hemmorhoids, maximize stool softeners 12.  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

## 2012-07-24 NOTE — Progress Notes (Signed)
Speech Language Pathology Session Note & Discharge Summary  Patient Details  Name: Rodney Riley MRN: [id number] Date of Birth: 02-07-1946  Today's Date: 07/24/2012 Time: 1315-1410 Time Calculation (min): 55 min  Skilled Therapeutic Intervention: Treatment focus on cognitive goals. Pt participated in higher-level organization and problem solving task with Mod I. Pt demonstrated errors, but was able to self-monitor and correct them with Mod I. Pt also independently demonstrated emergent awareness of deficits. Pt/family education completed in regards to current cognitive function and strategies to utilize at home to maximize cognitive function and independence.   Patient has met 3 of 3 long term goals.  Patient to discharge at overall Supervision;Modified Independent level.   Reasons goals not met: N/A   Clinical Impression/Discharge Summary: Pt has made functional gains and has met all LTG's this admission. Currently, pt is demonstrating behaviors consistent with a Rancho Level VIII and is overall supervision-Mod I for divided attention and self-monitoring and correcting of errors with complex problem solving tasks. Pt also demonstrates improvement in processing speed. Pt/family education complete and pt would benefit from f/u outpatient skilled SLP intervention to maximize cognitive functional and overall independence.   Care Partner:  Caregiver Able to Provide Assistance: Yes  Type of Caregiver Assistance: Physical;Cognitive  Recommendation:  Outpatient SLP  Rationale for SLP Follow Up: Maximize cognitive function and independence   Equipment: N/A   Reasons for discharge: Treatment goals met;Discharged from hospital   Patient/Family Agrees with Progress Made and Goals Achieved: Yes   See FIM for current functional status  Laketra Bowdish 07/24/2012, 3:30 PM

## 2012-07-24 NOTE — Progress Notes (Signed)
Physical Therapy Discharge Summary  Patient Details  Name: Rodney Riley MRN: [id number] Date of Birth: 01/31/1946  Today's Date: 07/24/2012 Time: 7829-5621 Time Calculation (min): 56 min  Community environment ambulation x 300' with RW, wife providing superivsion. Both able to negotiate inclines/declines of paved and brick surfaces, grassy terrain, and uneven pavement without overt loss of balance. Performed curb step outside. Pt tolerated highly distractible environment and visually busy environment without significant increase in dizziness. Pt does still have difficulty adjusting gaze from visual targets to feet and back to visual target, he must stop ambulation to perform. Pt also still reliant on RW, which is a decline from his prior level of function.  Car transfer modified independent with wife present. Both wife and pt have no further questions and feel excited to go home.   Patient has met 10 of 10 long term goals due to improved activity tolerance, improved balance, decreased pain, ability to compensate for deficits and improved ability to compensate for vestibular deficits.  Patient to discharge at an ambulatory level Modified Independent/supervision.   Patient's care partner is independent to provide the necessary physical assistance at discharge.  Reasons goals not met: NA  Recommendation:  Patient will benefit from ongoing skilled PT services in outpatient setting to continue to advance safe functional mobility, address ongoing impairments in vestibular deficits (BPPV and central vestibular impairments), impaired dynamic balance without assistive device, impaired ability to tolerate gait with visual challenges and head movements without loss of balance, and minimize fall risk.  Equipment: RW  Reasons for discharge: treatment goals met and discharge from hospital  Patient/family agrees with progress made and goals achieved: Yes  PT Discharge Precautions/Restrictions   Fall Pain Pain Assessment Pain Assessment: 0-10 Pain Score:   0/10  Cognition Overall Cognitive Status: Impaired Arousal/Alertness: Awake/alert Orientation Level: Oriented X4 Attention: Divided Selective Attention: Appears intact Alternating Attention: Appears intact Divided Attention: Impaired Divided Attention Impairment: Verbal complex;Functional complex Memory: Appears intact Awareness: Appears intact Problem Solving: Appears intact Self Monitoring: Appears intact Safety/Judgment: Appears intact Comments: Pt demonstrates mild delayed processing  Sensation Sensation Light Touch: Appears Intact Proprioception: Appears Intact Coordination Gross Motor Movements are Fluid and Coordinated: Yes Fine Motor Movements are Fluid and Coordinated: Yes  Mobility Bed Mobility Rolling Right: 6: Modified independent (Device/Increase time) Rolling Left: 6: Modified independent (Device/Increase time) Right Sidelying to Sit: 6: Modified independent (Device/Increase time) Sitting - Scoot to Edge of Bed: 6: Modified independent (Device/Increase time) Transfers Sit to Stand: 6: Modified independent (Device/Increase time) Stand to Sit: 6: Modified independent (Device/Increase time) Stand Pivot Transfers: 6: Modified independent (Device/Increase time) Locomotion  Ambulation Ambulation: Yes Ambulation/Gait Assistance: 6: Modified independent (Device/Increase time) Ambulation Distance (Feet): 150 Feet Assistive device: Rolling walker Ambulation/Gait Assistance Details: Pt requires RW at this time secondary to instabilities without device.  Gait Gait: Yes Gait Pattern: Decreased stride length (decreased foot clearance) High Level Ambulation High Level Ambulation: Side stepping;Backwards walking;Head turns Side Stepping: modified independent Backwards Walking: supervision Head Turns: supervision pending speed of head movement Stairs / Additional Locomotion Stairs: Yes Stairs  Assistance: 5: Supervision Stair Management Technique: One rail Left;Step to pattern;Forwards Number of Stairs: 22  Curb: 5: Programme researcher, broadcasting/film/video: No   Games developer Sitting - Balance Support: No upper extremity supported Static Sitting - Level of Assistance: 7: Independent Static Standing Balance Static Standing - Balance Support: Bilateral upper extremity supported Static Standing - Level of Assistance: 6: Modified independent (Device/Increase time) Extremity Assessment  RLE Assessment RLE Assessment: Exceptions to [facility] RLE Strength RLE Overall Strength Comments: ankle 5/5, knee 5/5 ext, 4/5 flexion LLE Assessment LLE Assessment: Exceptions to [facility] LLE Strength LLE Overall Strength Comments: all 5/5 except left hip flexion (not tested secondary to prevention of pain)  See FIM for current functional status  Wilhemina Bonito 07/24/2012, 5:34 PM

## 2012-07-24 NOTE — Progress Notes (Signed)
Physical Therapy Session Note  Patient Details  Name: Rodney Riley MRN: [id number] Date of Birth: 1946-08-17  Today's Date: 07/24/2012 Time: 0730-0828 Time Calculation (min): 58 min  Short Term Goals: Week 1:  PT Short Term Goal 1 (Week 1): Pt to independently verbalize 3/3 back precautions and demonstrate compliance with the back precautions 100% of treatment session.   PT Short Term Goal 2 (Week 1): Pt to perform bed mobility with supervision PT Short Term Goal 3 (Week 1): Transfers supervision with RW PT Short Term Goal 4 (Week 1): Gait >150' with RW supervision PT Short Term Goal 5 (Week 1): Stairs with bil rails 10 with supervision  Skilled Therapeutic Interventions/Progress Updates:    Discussed home management, donning brace and brace management at home. Wife up later for family ed. Gait training with and without assistive device working on head turns, visual scanning in varied environments with peripheral distractions. Car transfer x 2, first attempt with supervision for safety cues, second attempt performed modified independent with RW. Gait x >600' with RW, modified independent without significant challenge, supervision with challenges.    Second Session Skilled Therapeutic Interventions/Progress Updates:  Time: 1030-1100 Time Calculation (min): 30 min Session focused on caregiver education. Educated wife on positioning for supervision of gait (if needed, pt currently modified independent with controlled environment gait with RW). Performed 22 steps with wife providing supervision, educated on positioning and placement of visual targets, management of RW. Pt made modified independent in room, discussed what this entails. Pt to call nursing for back brace if wife not in room to assist with donning. Nursing aware.   Therapy Documentation Precautions:  Precautions Precautions: Back;Fall Type of Shoulder Precautions: AROM as tolerated; has a sling for comfort Required Braces  or Orthoses: Spinal Brace Spinal Brace: Thoracolumbosacral orthotic;Applied in supine position Restrictions Weight Bearing Restrictions: No Other Position/Activity Restrictions: limited LT UE weight bearing Pain First session: 0/10  Pain Second session: Pain Assessment Pain Assessment: 0-10 Pain Score:   4 Pain Type: Acute pain Pain Location: Rib cage Pain Orientation: Left Pain Descriptors: Aching Pain Onset: With Activity Pain Intervention(s): Repositioned  See FIM for current functional status  Therapy/Group: Individual Therapy both sessions  Wilhemina Bonito 07/24/2012, 12:24 PM

## 2012-07-24 NOTE — Progress Notes (Signed)
Occupational Therapy Session Note  Patient Details  Name: [name] MRN: [id number] Date of Birth: 1946/03/30  Today's Date: 07/24/2012 Time: 0900-1005 Time Calculation (min):65 min  Short Term Goals: Week 1:  OT Short Term Goal 1 (Week 1): Use gaze stabilitzation techniques for transfers to decrease nausea OT Short Term Goal 2 (Week 1): Pt will transfer to toilet/ BSC with min A OT Short Term Goal 3 (Week 1): Pt will perform toileting 3/3 steps with mod A OT Short Term Goal 4 (Week 1): Pt will roll in bed (side to side) with supervision for TLSO to be donned OT Short Term Goal 5 (Week 1): Pt will don shirt in supine position with mod A (rolling)  Skilled Therapeutic Interventions/Progress Updates:    Pt engaged in bathing at shower level and dressing at bed level.  Pt's wife present and assisted patient with doffing/donning TLSO and changing pads.  Pt's wife demonstrated appropriate assistance/supervision during all ADLs.  Pt exhibited no unsafe behaviors throughout session and sustained attention throughout with supervison.  Focus on family education, safety awareness, activity tolerance, and functional ambulation for home mgmt tasks.  Therapy Documentation Precautions:  Precautions Precautions: Back;Fall Type of Shoulder Precautions: AROM as tolerated; has a sling for comfort Required Braces or Orthoses: Spinal Brace Spinal Brace: Thoracolumbosacral orthotic;Applied in supine position Restrictions Weight Bearing Restrictions: No Other Position/Activity Restrictions: limited LT UE weight bearing  See FIM for current functional status  Therapy/Group: Individual Therapy  Rich Brave 07/24/2012, 4:10 PM

## 2012-07-24 NOTE — Progress Notes (Signed)
[facility]patient [facility] Individual Statement of Services  Patient Name:  Rodney Riley  Date:  07/24/2012  Welcome to the [facility]patient Rehabilitation [facility].  Our goal is to provide you with an individualized program based on your diagnosis and situation, designed to meet your specific needs.  With this comprehensive rehabilitation program, you will be expected to participate in at least 3 hours of rehabilitation therapies Monday-Friday, with modified therapy programming on the weekends.  Your rehabilitation program will include the following services:  Physical Therapy (PT), Occupational Therapy (OT), Speech Therapy (ST), 24 hour per day rehabilitation nursing, Therapeutic Recreaction (TR), Neuropsychology, Case Management ( Social Worker), Rehabilitation Medicine, Nutrition Services and Pharmacy Services  Weekly team conferences will be held on Tuesdays to discuss your progress.  Your  Social Worker will talk with you frequently to get your input and to update you on team discussions.  Team conferences with you and your family in attendance may also be held.  Expected length of stay: 10-14 days  Overall anticipated outcome: modified independent  Depending on your progress and recovery, your program may change.  Your  Social Worker will coordinate services and will keep you informed of any changes.  Your  Social Worker's name and contact numbers are listed  below.  The following services may also be recommended but are not provided by the Inpatient Rehabilitation Center:   Driving Evaluations  Home Health Rehabiltiation Services  Outpatient Rehabilitatation [facility]  Vocational Rehabilitation   Arrangements will be made to provide these services after discharge if needed.  Arrangements include referral to agencies that provide these services.  Your insurance has been verified to be:  [employer] Your primary doctor is:  Dr. Kevan Ny  Pertinent information will be shared with  your doctor and your insurance company.  Social Worker:  [place], [place] [phone] or ([phone]  Information discussed with and copy given to patient by: Amada Jupiter, 07/24/2012, 10:06 AM

## 2012-07-24 NOTE — Progress Notes (Signed)
Patient does not use nocturnal CPAP at home and states he has never had a sleep study or a sleep apnea diagnoses. VSS on room air. CPAP order discontinued per RT protocol.

## 2012-07-24 NOTE — Progress Notes (Signed)
Social Work Patient ID: Rodney Riley, male   DOB: [birth year], 66 y.o.   MRN: [id number]  Team feels that pt has continued to make excellent gains and is ready for d/c home with wife tomorrow.  Have alerted MD who has given medical clearance for d/c 07/25/12.  This SW arranging for OP therapy follow up.  Anhthu Perdew

## 2012-07-25 DIAGNOSIS — W11XXXA Fall on and from ladder, initial encounter: Secondary | ICD-10-CM

## 2012-07-25 DIAGNOSIS — S069X9A Unspecified intracranial injury with loss of consciousness of unspecified duration, initial encounter: Secondary | ICD-10-CM

## 2012-07-25 MED ORDER — PNEUMOCOCCAL VAC POLYVALENT 25 MCG/0.5ML IJ INJ
0.5000 mL | INJECTION | Freq: Once | INTRAMUSCULAR | Status: AC
  Administered 2012-07-25: 0.5 mL via INTRAMUSCULAR
  Filled 2012-07-25: qty 0.5

## 2012-07-25 MED ORDER — INFLUENZA VIRUS VACC SPLIT PF IM SUSP
0.5000 mL | INTRAMUSCULAR | Status: AC | PRN
  Administered 2012-07-25: 0.5 mL via INTRAMUSCULAR
  Filled 2012-07-25: qty 0.5

## 2012-07-25 MED ORDER — METHOCARBAMOL 500 MG PO TABS: 500.0000 mg | ORAL_TABLET | Freq: Four times a day (QID) | ORAL | Status: DC | PRN

## 2012-07-25 MED ORDER — HYDROCODONE-ACETAMINOPHEN 5-325 MG PO TABS: 0.5000 | ORAL_TABLET | ORAL | Status: DC | PRN

## 2012-07-25 NOTE — Progress Notes (Signed)
1049 Pt. Discharged from rehab to home with wife.  Theron Arista, PA provided all discharge instructions.  Flu and Pneumonia vaccine provided prior to discharge with education complete.  No further questions asked.  All personal belongings in tow.

## 2012-07-25 NOTE — Patient Care Conference (Signed)
Inpatient RehabilitationTeam Conference Note Date: 07/25/2012   Time: 2:45 PM    Patient Name: Rodney Riley      Medical Record Number: [id number]  Date of Birth: Mar 09, 1946 Sex: Male         Room/Bed: 4007/4007-01 Payor Info: Payor: [employer] [place] [employer]IC[employer]  Plan: BLUE MEDICARE [employer]  Product Type: *No Product type*     Admitting Diagnosis: TBI, FXS T11,12, L1,2  Admit Date/Time:  07/18/2012  4:23 PM Admission Comments: No comment available   Primary Diagnosis:  Trauma Principal Problem: Trauma  Patient Active Problem List   Diagnosis Date Noted  . Trauma 07/19/2012  . Fall 07/18/2012  . Multiple fractures of ribs of left side 07/18/2012  . Traumatic subdural hematoma 07/18/2012  . Traumatic subarachnoid hemorrhage 07/18/2012  . T11 vertebral fracture 07/18/2012  . T12 vertebral fracture 07/18/2012  . L1 vertebral fracture 07/18/2012  . L2 vertebral fracture 07/18/2012  . Left scapula fracture 07/12/2012    Expected Discharge Date: Expected Discharge Date: 07/25/12  Team Members Present:       Current Status/Progress Goal Weekly Team Focus  Medical   prgressing quickly, no med issues  maintain medical stability  D/C planning   Bowel/Bladder             Swallow/Nutrition/ Hydration             ADL's   supervision/min A overall  supervision bathing; min A dressing; supervision toilet transfers and shower transfers  family education   Mobility             Communication             Safety/Cognition/ Behavioral Observations            Pain             Skin              Rehab Goals Patient on target to meet rehab goals: Yes *See Interdisciplinary Assessment and Plan and progress notes for long and short-term goals  Barriers to Discharge: None    Possible Resolutions to Barriers:  D/C ed    Discharge Planning/Teaching Needs:  home with wife who can provide 24/7 assistance      Team Discussion:  Excellent gains. No concerns and ready  for d/c  Revisions to Treatment Plan:  none   Continued Need for Acute Rehabilitation Level of Care: The patient requires daily medical management by a physician with specialized training in physical medicine and rehabilitation for the following conditions: Daily direction of a multidisciplinary physical rehabilitation program to ensure safe treatment while eliciting the highest outcome that is of practical value to the patient.: Yes Daily medical management of patient stability for increased activity during participation in an intensive rehabilitation regime.: Yes Daily analysis of laboratory values and/or radiology reports with any subsequent need for medication adjustment of medical intervention for : Neurological problems;Other  Cree Kunert 07/25/2012, 6:25 PM

## 2012-07-25 NOTE — Discharge Summary (Signed)
NAME:  Rodney Riley, POYTHRESS NO.:  [id number]  MEDICAL RECORD NO.:  [id number]  LOCATION:  4007                         FACILITY:  [facility]  PHYSICIAN:  Ranelle Oyster, M.D.DATE OF BIRTH:  1946-03-06  DATE OF ADMISSION:  07/18/2012 DATE OF DISCHARGE:  07/25/2012                              DISCHARGE SUMMARY   DISCHARGE DIAGNOSES: 1. Traumatic brain injury, subarachnoid hemorrhage with subdural     hematoma, skull fracture. 2. Multiple thoracic lumbar fractures at T11, T12, L1 and L2 levels,     left scapula fracture, multiple left rib fractures.  Sequential     compression devices for deep vein thrombosis prophylaxis, pain     management, hypertension, coronary artery disease, hyperlipidemia.  This is a 66 year old right-handed male with a history of coronary artery disease and bypass grafting admitted July 12, 2012, after a fall from a ladder while cleaning his gutters when he fell approximately 10-12 feet without loss of consciousness.  He complained of left shoulder pain and back pain.  Cranial CT scan showed small amount of subarachnoid hemorrhage as well as small right subdural hematoma without hydrocephalus or midline shift.  There was also a fracture of left occipital bone and diastasis of the left lamboid suture.  The patient also sustained multiple thoracic lumbar fractures at T11, T12, L1 and L2 levels.  The patient suffered left scapular fracture as well as multiple left rib fractures.  Neurosurgery, Dr. Newell Coral consulted, placed in TLSO back brace for multiple thoracic lumbar fractures with planned followup lumbar thoracic spine films as well as observation of subarachnoid subdural hematoma.  Followup Orthopedic Service Dr. Carola Frost in regards to comminuted left scapular fracture, felt to be nonoperative and placed in a shoulder sling.  CT of left shoulder November 5 for followup of scapular fracture reviewed by Orthopedic Services, continue recommend  nonoperative management.  Hospital course pain management. The patient was admitted for comprehensive rehab program.  PAST MEDICAL HISTORY:  See discharge diagnoses.  SOCIAL HISTORY:  Lives with spouse and assistance as needed.  Functional history prior to admission was independent and retired.  Functional status upon admission to rehab services is a +2 total assist to ambulate 5 feet with 2 person handheld assistance.  PHYSICAL EXAMINATION:  VITAL SIGNS:  Blood pressure 134/64, pulse 86, temperature 97.9, respirations 16. GENERAL:  This is an alert male in no acute distress, oriented to person, place, and time.  He appeared well developed, followed 3 step commands.  The patient can recall full events of the fall. LUNGS:  Clear to auscultation. CARDIAC:  Regular rate and rhythm. ABDOMEN:  Soft, nontender.  Good bowel sounds.  REHABILITATION HOSPITAL COURSE:  The patient was admitted to inpatient rehab services with therapies initiated on a 3-hour daily basis consisting of physical therapy, occupational therapy, speech therapy, and rehabilitation nursing.  The following issues were addressed during the patient's rehabilitation stay.  Pertaining to Mr. Schetter's traumatic brain injury, subarachnoid hemorrhage, skull fracture, followed by Neurosurgery Dr. Newell Coral with conservative care.  Latest cranial CT scan was stable.  The patient had no complaints of headache.  Multiple thoracic lumbar fractures T11, T12, L1 and L2 levels with TLSO back brace when out  of bed.  No operative management was needed.  Followup films showed good stable appearance.  Left scapular fracture again with conservative care, shoulder sling in place.  Follow up with Orthopedic Service Dr. Carola Frost.  The patient with multiple left rib fractures, conservative care, pain management ongoing with hydrocodone and good results.  His blood pressures were well controlled on low-dose lisinopril.  He would follow up with his  primary MD.  He did have a history of coronary artery disease with bypass grafting.  He was on aspirin prior to admission.  He would follow up Neurosurgery in regards on when this could be resumed.  The patient received weekly collaborative interdisciplinary team conferences to discuss estimated length of stay, family teaching, and any barriers to discharge.  He continued to progress nicely.  He was modified independent in his room with back brace in place.  Full family teaching ongoing.  He did receive followup by speech therapy in regards to traumatic brain injury after fall.  The patient was demonstrating behaviors consistent with a Rancho level 8, overall supervision modified independent for divided attention and self-monitoring and correcting of hairs with complex problem solving task.  He demonstrated improvement in processing speed.  He was independent with activities of daily living except needing some assistance for his brace.  He was advised no driving.  DISCHARGE MEDICATIONS:  At the time of dictation included; 1. Lipitor 40 mg daily. 2. Hydrocodone one half to two tablets every 4 hours as needed for     pain. 3. Lisinopril 10 mg daily. 4. Robaxin 500 mg every 6 hours as needed muscle spasms. 5. Multivitamin daily. 6. Vitamin E 400 units daily.  DIET:  Regular.  SPECIAL INSTRUCTIONS:  Back brace when out of bed.  No driving.  Follow up Dr. Newell Coral, Neurosurgery, in regards to when resume full-strength aspirin back.  Follow up with Dr. Kevan Ny, medical management, on July 28, 2012, Dr. Newell Coral 2 weeks call for appointment; Dr. Myrene Galas call for appointment; Dr. Faith Rogue appointment to be [facility].  Ongoing therapies were dictated as per [employer].     Mariam Dollar, P.A.   ______________________________ Ranelle Oyster, M.D.    DA/MEDQ  D:  07/25/2012  T:  07/25/2012  Job:  [id number]  cc:   Pearla Dubonnet,  M.D. Hewitt Shorts, M.D. Doralee Albino. Carola Frost, M.D. Cherylynn Ridges, M.D. Ranelle Oyster, M.D.

## 2012-07-25 NOTE — Progress Notes (Signed)
Social Work  Discharge Note  The overall goal for the admission was met for:   Discharge location: Yes - home with wife  Length of Stay: Yes - 7 days  Discharge activity level: Yes - supervision to modified independent  Home/community participation: Yes  Services provided included: MD, RD, PT, OT, SLP, RN, TR, Pharmacy, Neuropsych and SW  Financial Services: Medicare  Follow-up services arranged: Outpatient: PT, ST via [facility], DME: rolling walker and tub seat via [facility], Other: provided info on local TBI Support Group and Patient/Family has no preference for HH/DME agencies  Comments (or additional information):  Patient/Family verbalized understanding of follow-up arrangements: Yes  Individual responsible for coordination of the follow-up plan: patient  Confirmed correct DME delivered: Rodney Riley 07/25/2012    Stella Bortle

## 2012-07-25 NOTE — Progress Notes (Signed)
Patient ID: Rodney Riley, male   DOB: 12/06/1945, 66 y.o.   MRN: [id number]   Subjective/Complaints: Admitted 07/12/2012 after a fall from a ladder while cleaning his gutters when he fell approximately 10-12 feet without loss of consciousness. He complained of left shoulder pain and back pain    Objective: Vital Signs: Blood pressure 154/77, pulse 93, temperature 97.8 F (36.6 C), temperature source Oral, resp. rate 19, height 5' 4.96" (1.65 m), weight 64.6 kg (142 lb 6.7 oz), SpO2 96.00%. Dg Abd 1 View  07/19/2012  *RADIOLOGY REPORT*  Clinical Data: Constipation  ABDOMEN - 1 VIEW  Comparison: None.  Findings: No evidence of dilated loops of large or small bowel. Small amounts stool in the rectum.  Gallstones in the gallbladder noted.  There is gas scattered throughout the large and small bowel. Posterior left rib fractures again noted.  IMPRESSION: 1. No evidence of bowel obstruction.  2. No significant stool burden to suggest constipation.  3. Cholelithiasis.  4. Rib fractures on the left.   Original Report Authenticated By: Genevive Bi, M.D.    Results for orders placed during the hospital encounter of 07/18/12 (from the past 72 hour(s))  CBC WITH DIFFERENTIAL     Status: Abnormal   Collection Time   07/19/12  6:15 AM      Component Value Range Comment   WBC 8.6  4.0 - 10.5 K/uL    RBC 3.85 (*) 4.22 - 5.81 MIL/uL    Hemoglobin 11.8 (*) 13.0 - 17.0 g/dL    HCT 40.9 (*) 81.1 - 52.0 %    MCV 86.2  78.0 - 100.0 fL    MCH 30.6  26.0 - 34.0 pg    MCHC 35.5  30.0 - 36.0 g/dL    RDW 91.4  78.2 - 95.6 %    Platelets 328  150 - 400 K/uL    Neutrophils Relative 64  43 - 77 %    Neutro Abs 5.5  1.7 - 7.7 K/uL    Lymphocytes Relative 16  12 - 46 %    Lymphs Abs 1.4  0.7 - 4.0 K/uL    Monocytes Relative 16 (*) 3 - 12 %    Monocytes Absolute 1.4 (*) 0.1 - 1.0 K/uL    Eosinophils Relative 4  0 - 5 %    Eosinophils Absolute 0.3  0.0 - 0.7 K/uL    Basophils Relative 0  0 - 1 %    Basophils Absolute 0.0  0.0 - 0.1 K/uL   COMPREHENSIVE METABOLIC PANEL     Status: Abnormal   Collection Time   07/19/12  6:15 AM      Component Value Range Comment   Sodium 127 (*) 135 - 145 mEq/L    Potassium 4.0  3.5 - 5.1 mEq/L    Chloride 89 (*) 96 - 112 mEq/L    CO2 28  19 - 32 mEq/L    Glucose, Bld 133 (*) 70 - 99 mg/dL    BUN 17  6 - 23 mg/dL    Creatinine, Ser 2.13  0.50 - 1.35 mg/dL    Calcium 9.7  8.4 - 08.6 mg/dL    Total Protein 6.6  6.0 - 8.3 g/dL    Albumin 2.8 (*) 3.5 - 5.2 g/dL    AST 35  0 - 37 U/L    ALT 38  0 - 53 U/L    Alkaline Phosphatase 158 (*) 39 - 117 U/L    Total Bilirubin 0.7  0.3 - 1.2 mg/dL    GFR calc non Af Amer >90  >90 mL/min    GFR calc Af Amer >90  >90 mL/min      HEENT: normal Cardio: RRR Resp: CTA B/L GI: BS positive Extremity:  Pulses positive Skin:   Intact Neuro: Anxious, Cranial Nerve II-XII normal, Normal Sensory and Abnormal Motor 5/5 RUE,4/5 L delt and tri, 5/5 grip, 4/5 in BLE Musc/Skel:  Other No pain with UE or LE AROM Oriented to person and place but not day and date   Assessment/Plan: 1. Functional deficits secondary to TBI, thoraco-lumbar fx's, left scapula fx    medically stable for D/C  F/u ortho F/u PCP F/u NS F/u Dr Riley Kill    Medical Problem List and Plan:  1. 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 Multiple thoracic lumbar fractures at T11, T12, L1 and L2 levels. TLSO brace when out of bed  3. Left scapular fracture. Conservative care with shoulder sling/nonoperative  4. Multiple left rib fractures. Conservative care  5. DVT Prophylaxis/Anticoagulation: SCDs. Monitor for signs of DVT  6. Pain Management: Hydrocodone and Robaxin as needed. Monitor mental status  7. Neuropsych: This patient is capable of making decisions on his/her own behalf.  8. Hypertension. Lisinopril 10 mg daily. Monitor with increased mobility  9. Coronary artery disease with history of bypass grafting. Will  discuss on when aspirin 325 mg can be resumed  10. Hyperlipidemia. Lipitor 11.  Constipation with aggravation of hemmorhoids, maximize stool softeners 12.  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

## 2012-07-25 NOTE — Progress Notes (Signed)
Occupational Therapy Discharge Summary  Patient Details  Name: Rodney Riley MRN: [id number] Date of Birth: April 27, 1946  Today's Date: 07/25/2012  Patient has met 13 of 13 long term goals due to improved activity tolerance, improved balance, postural control, ability to compensate for deficits, improved attention, improved awareness, improved coordination and improved vestibular deficits.  Pt made excellent progress with bathing, dressing, toilet transfers and toileting, and shower transfers during this admission.  Pt is supervision for bathing, toilet and shower transfers, and toileting.  Pt requires min A with UB and LB dressing.  Pt independently utilizes gaze stabilization strategies/techniquest to decrease nausea and dizziness.  Pt's wife participated in therapies and demonstrated independence with assisting doffing/donning TLSO and providing appropriate assistance for patient.Patient to discharge at overall supervision to min A level.  Patient's care partner is independent to provide the necessary physical and cognitive assistance at discharge.      Recommendation:  Patient will benefit from ongoing skilled OT services in home health setting to continue to advance functional skills in the area of BADL.  Equipment: tub bench  Reasons for discharge: treatment goals met and discharge from hospital  Patient/family agrees with progress made and goals achieved: Yes  OT Discharge   ADL ADL Equipment Provided: Long-handled sponge Eating: Independent Where Assessed-Eating: Chair Grooming: Independent Where Assessed-Grooming: Sitting at sink Upper Body Bathing: Supervision/safety Where Assessed-Upper Body Bathing: Shower Lower Body Bathing: Supervision/safety Where Assessed-Lower Body Bathing: Shower Upper Body Dressing: Minimal assistance Where Assessed-Upper Body Dressing: Bed level Lower Body Dressing: Minimal assistance Where Assessed-Lower Body Dressing: Edge of  bed Toileting: Supervision/safety Where Assessed-Toileting: Teacher, adult education: Close supervision Toilet Transfer Method: Insurance claims handler: Close supervison Web designer Method: Ship broker: Emergency planning/management officer Vision/Perception  Vision - History Baseline Vision: Wears glasses all the time Patient Visual Report: Nausea/blurring vision with head movement Vision - Assessment Eye Alignment: Within Functional Limits Additional Comments: Decreased speed of saccadic movement  Perception Perception: Within Functional Limits Praxis Praxis: Intact  Cognition Overall Cognitive Status: Impaired Arousal/Alertness: Awake/alert Orientation Level: Oriented X4 Attention: Divided Selective Attention: Appears intact Selective Attention Impairment: Verbal basic;Functional basic Alternating Attention: Appears intact Alternating Attention Impairment: Verbal basic;Functional basic Divided Attention: Appears intact Divided Attention Impairment: Verbal basic;Functional basic Memory: Appears intact Awareness: Appears intact Awareness Impairment: Emergent impairment Problem Solving: Appears intact Ranch[place] Mirant Scales of Cognitive Functioning: Purposeful/appropriate Sensation Sensation Light Touch: Appears Intact Stereognosis: Appears Intact Hot/Cold: Appears Intact Proprioception: Appears Intact Coordination Gross Motor Movements are Fluid and Coordinated: Yes Fine Motor Movements are Fluid and Coordinated: Yes Motor  Motor Motor: Within Functional Limits    Trunk/Postural Assessment  Thoracic Assessment Thoracic Assessment: Exceptions to [facility] (limited by TLSO) Lumbar Assessment Lumbar Assessment: Exceptions to [facility] (limited by TLSO) Postural Control Postural Control: Within Functional Limits  Balance Static Sitting Balance Static Sitting - Balance Support: No upper extremity supported Static Sitting - Level of Assistance: 7:  Independent Static Standing Balance Static Standing - Balance Support: Bilateral upper extremity supported Static Standing - Level of Assistance: 6: Modified independent (Device/Increase time) Extremity/Trunk Assessment RUE Assessment RUE Assessment: Within Functional Limits LUE Assessment LUE Assessment: Exceptions to [facility] (normal ROM in elbow, wrist and hand. shoulder flexion to )  See FIM for current functional status  Rich Brave 07/25/2012, 10:13 AM

## 2012-07-25 NOTE — Discharge Summary (Signed)
  Discharge summary job [id number]

## 2012-07-31 ENCOUNTER — Ambulatory Visit: Payer: Medicare Other | Admitting: Physical Therapy

## 2012-07-31 ENCOUNTER — Ambulatory Visit: Payer: Medicare Other | Attending: Physical Medicine & Rehabilitation

## 2012-07-31 DIAGNOSIS — Z5189 Encounter for other specified aftercare: Secondary | ICD-10-CM | POA: Insufficient documentation

## 2012-07-31 DIAGNOSIS — R262 Difficulty in walking, not elsewhere classified: Secondary | ICD-10-CM | POA: Insufficient documentation

## 2012-07-31 DIAGNOSIS — R4189 Other symptoms and signs involving cognitive functions and awareness: Secondary | ICD-10-CM | POA: Insufficient documentation

## 2012-07-31 DIAGNOSIS — R5381 Other malaise: Secondary | ICD-10-CM | POA: Insufficient documentation

## 2012-08-08 ENCOUNTER — Encounter: Payer: Medicare Other | Attending: Physical Medicine & Rehabilitation | Admitting: Physical Medicine & Rehabilitation

## 2012-08-08 ENCOUNTER — Encounter: Payer: Self-pay | Admitting: Physical Medicine & Rehabilitation

## 2012-08-08 VITALS — BP 147/75 | HR 115 | Resp 14 | Ht 65.0 in | Wt 133.0 lb

## 2012-08-08 DIAGNOSIS — S066X9A Traumatic subarachnoid hemorrhage with loss of consciousness of unspecified duration, initial encounter: Secondary | ICD-10-CM

## 2012-08-08 DIAGNOSIS — T1490XA Injury, unspecified, initial encounter: Secondary | ICD-10-CM

## 2012-08-08 DIAGNOSIS — S066XAA Traumatic subarachnoid hemorrhage with loss of consciousness status unknown, initial encounter: Secondary | ICD-10-CM

## 2012-08-08 DIAGNOSIS — S32009A Unspecified fracture of unspecified lumbar vertebra, initial encounter for closed fracture: Secondary | ICD-10-CM | POA: Insufficient documentation

## 2012-08-08 DIAGNOSIS — S064X9A Epidural hemorrhage with loss of consciousness of unspecified duration, initial encounter: Secondary | ICD-10-CM | POA: Insufficient documentation

## 2012-08-08 DIAGNOSIS — S065X9A Traumatic subdural hemorrhage with loss of consciousness of unspecified duration, initial encounter: Secondary | ICD-10-CM

## 2012-08-08 DIAGNOSIS — S0280XA Fracture of other specified skull and facial bones, unspecified side, initial encounter for closed fracture: Secondary | ICD-10-CM | POA: Insufficient documentation

## 2012-08-08 DIAGNOSIS — S065XAA Traumatic subdural hemorrhage with loss of consciousness status unknown, initial encounter: Secondary | ICD-10-CM

## 2012-08-08 DIAGNOSIS — S22089A Unspecified fracture of T11-T12 vertebra, initial encounter for closed fracture: Secondary | ICD-10-CM

## 2012-08-08 DIAGNOSIS — S2249XA Multiple fractures of ribs, unspecified side, initial encounter for closed fracture: Secondary | ICD-10-CM | POA: Insufficient documentation

## 2012-08-08 DIAGNOSIS — X58XXXA Exposure to other specified factors, initial encounter: Secondary | ICD-10-CM | POA: Insufficient documentation

## 2012-08-08 DIAGNOSIS — S42109A Fracture of unspecified part of scapula, unspecified shoulder, initial encounter for closed fracture: Secondary | ICD-10-CM | POA: Insufficient documentation

## 2012-08-08 DIAGNOSIS — S22009A Unspecified fracture of unspecified thoracic vertebra, initial encounter for closed fracture: Secondary | ICD-10-CM

## 2012-08-08 NOTE — Progress Notes (Signed)
Subjective:    Patient ID: Rodney Riley, male    DOB: [birth year], 66 y.o.   MRN: [id number]  HPI  Rodney Riley is back regarding his fall and associated TBI, and polytrauma. He is walking without a walker at home for the most part. He uses the walker outside generally. He has a few more sessions with PT still at the outpt center  He does have some pain in the scapular fx site. Dr. Carola Frost gave him a good report this week. He is still using 4-5 hydrocodones each day, and he does mix in tylenol as well.   From a cognitive standpoint he's back to his perceived baseline except for mild deficits in divided attention and multi-tasking. He had questions regarding his prognosis from a standpoint of his BI.    Pain Inventory Average Pain 3 Pain Right Now 2 My pain is dull  In the last 24 hours, has pain interfered with the following? General activity 0 Relation with others 0 Enjoyment of life 0 What TIME of day is your pain at its worst? evening Sleep (in general) Fair  Pain is worse with: unsure Pain improves with: rest, pacing activities and medication Relief from Meds: 7  Mobility walk without assistance use a walker how many minutes can you walk? 15-20 ability to climb steps?  yes do you drive?  no transfers alone Do you have any goals in this area?  yes  Function retired I need assistance with the following:  dressing and bathing Do you have any goals in this area?  yes  Neuro/Psych dizziness  Prior Studies Any changes since last visit?  no  Physicians involved in your care Any changes since last visit?  no   Family History  Problem Relation Age of Onset  . Diabetes Mother   . Alzheimer's disease Mother   . Heart disease Mother   . Cancer Father   . COPD Father   . Heart disease Father    History   Social History  . Marital Status: Married    Spouse Name: N/A    Number of Children: N/A  . Years of Education: N/A   Social History Main Topics  .  Smoking status: Never Smoker   . Smokeless tobacco: None  . Alcohol Use: None  . Drug Use:   . Sexually Active:    Other Topics Concern  . None   Social History Narrative   Pt is a retired Acupuncturist    Past Surgical History  Procedure Date  . Cardiac surgery   . Lumbar disc surgery    Past Medical History  Diagnosis Date  . Coronary artery disease   . Hypertension   . Low back pain   . Hyperlipidemia    BP 147/75  Pulse 115  Resp 14  Ht 5\' 5"  (1.651 m)  Wt 133 lb (60.328 kg)  BMI 22.13 kg/m2  SpO2 95%    Review of Systems  Neurological: Positive for dizziness.  All other systems reviewed and are negative.       Objective:   Physical Exam   General: Alert and oriented x 3, No apparent distress. He is wearing his TLSO. HEENT: Head is normocephalic, atraumatic, PERRLA, EOMI, sclera anicteric, oral mucosa pink and moist, dentition intact, ext ear canals clear,  Neck: Supple without JVD or lymphadenopathy Heart: Reg rate and rhythm. No murmurs rubs or gallops Chest: CTA bilaterally without wheezes, rales, or rhonchi; no distress Abdomen: Soft, non-tender, non-distended, bowel sounds  positive. Extremities: No clubbing, cyanosis, or edema. Pulses are 2+ Skin: Clean and intact without signs of breakdown Neuro: Pt is cognitively appropriate with normal insight, memory, and awareness. Cranial nerves 2-12 are intact. Sensory exam is normal except for lateral left foot. Reflexes are 2+ in all 4's. Fine motor coordination is intact. Did have mild loss of balance to the left with HTT gait. No tremors. Motor function is grossly 5/5. He remembered 3/3 words after 5 minutes. He was able to sequence information. Serial 7's were intact.  Musculoskeletal: Full ROM, No pain with AROM or PROM in the neck, trunk, or extremities. Posture appropriate Psych: Pt's affect is appropriate. Pt is cooperative        Assessment & Plan:  1. Traumatic brain injury/subarachnoid  hemorrhage/SDH/skull fracture  -he has made remarkable progress 2. Multiple thoracic lumbar fractures at T11, T12, L1 and L2 levels. TLSO brace when out of bed---continue for 2 more months per NS 3. Left scapular fracture.    -encouraged ROM as tolerated  -it would be beneficial to use the arm/shoulder for everyday functional activities as well 4. Multiple left rib fractures. Conservative care  6. Pain Management: Hydrocodone may continue  -wean hydrocodone as able.  -transition to tylenol as possible 7. I educated the patient and his wife regarding his TBI. Given his remarkable progress, I don't feel that he will have any restricitions in the long -run in resuming his social, family, leisure activities as he was previously involved in. He does have my permission to drive from a cognitive standpoint. He will follow up with Dr. Newell Coral regarding his brace and physical capacityto drive.

## 2012-08-08 NOTE — Patient Instructions (Signed)
CALL ME WITH QUESTIONS 

## 2012-08-09 ENCOUNTER — Ambulatory Visit: Payer: Medicare Other | Admitting: Physical Therapy

## 2012-08-15 ENCOUNTER — Ambulatory Visit: Payer: Medicare Other | Attending: Orthopedic Surgery | Admitting: Rehabilitative and Restorative Service Providers"

## 2012-08-15 ENCOUNTER — Ambulatory Visit: Payer: Medicare Other | Admitting: Occupational Therapy

## 2012-08-15 DIAGNOSIS — Z5189 Encounter for other specified aftercare: Secondary | ICD-10-CM | POA: Insufficient documentation

## 2012-08-15 DIAGNOSIS — M25619 Stiffness of unspecified shoulder, not elsewhere classified: Secondary | ICD-10-CM | POA: Insufficient documentation

## 2012-08-15 DIAGNOSIS — R262 Difficulty in walking, not elsewhere classified: Secondary | ICD-10-CM | POA: Insufficient documentation

## 2012-08-15 DIAGNOSIS — R5381 Other malaise: Secondary | ICD-10-CM | POA: Insufficient documentation

## 2012-08-17 ENCOUNTER — Ambulatory Visit: Payer: Medicare Other | Admitting: Physical Therapy

## 2012-08-22 ENCOUNTER — Ambulatory Visit: Payer: Medicare Other | Attending: Physical Medicine & Rehabilitation | Admitting: Physical Therapy

## 2012-08-22 ENCOUNTER — Ambulatory Visit: Payer: Medicare Other | Admitting: Occupational Therapy

## 2012-08-22 DIAGNOSIS — R4189 Other symptoms and signs involving cognitive functions and awareness: Secondary | ICD-10-CM | POA: Insufficient documentation

## 2012-08-22 DIAGNOSIS — R5381 Other malaise: Secondary | ICD-10-CM | POA: Insufficient documentation

## 2012-08-22 DIAGNOSIS — R262 Difficulty in walking, not elsewhere classified: Secondary | ICD-10-CM | POA: Insufficient documentation

## 2012-08-22 DIAGNOSIS — Z5189 Encounter for other specified aftercare: Secondary | ICD-10-CM | POA: Insufficient documentation

## 2012-08-24 ENCOUNTER — Ambulatory Visit: Payer: Medicare Other | Admitting: Occupational Therapy

## 2012-08-24 ENCOUNTER — Ambulatory Visit: Payer: Medicare Other | Admitting: Physical Therapy

## 2012-08-28 ENCOUNTER — Ambulatory Visit: Payer: Medicare Other | Admitting: Occupational Therapy

## 2012-08-28 ENCOUNTER — Encounter: Payer: Medicare Other | Admitting: Occupational Therapy

## 2012-08-29 ENCOUNTER — Encounter: Payer: Medicare Other | Admitting: Occupational Therapy

## 2012-08-29 ENCOUNTER — Ambulatory Visit: Payer: Medicare Other | Admitting: Physical Therapy

## 2012-08-29 ENCOUNTER — Encounter: Payer: Medicare Other | Admitting: *Deleted

## 2012-08-29 ENCOUNTER — Ambulatory Visit: Payer: Medicare Other | Admitting: Occupational Therapy

## 2012-08-31 ENCOUNTER — Ambulatory Visit: Payer: Medicare Other | Admitting: Physical Therapy

## 2012-09-04 ENCOUNTER — Encounter: Payer: Medicare Other | Admitting: Occupational Therapy

## 2012-09-04 ENCOUNTER — Ambulatory Visit: Payer: Medicare Other | Admitting: Occupational Therapy

## 2012-09-11 ENCOUNTER — Encounter: Payer: Medicare Other | Admitting: Occupational Therapy

## 2012-09-11 ENCOUNTER — Ambulatory Visit: Payer: Medicare Other | Admitting: Occupational Therapy

## 2012-09-14 ENCOUNTER — Ambulatory Visit: Payer: Medicare Other | Attending: Orthopedic Surgery | Admitting: Occupational Therapy

## 2012-09-14 ENCOUNTER — Encounter: Payer: Medicare Other | Admitting: Occupational Therapy

## 2012-09-14 ENCOUNTER — Ambulatory Visit: Payer: Medicare Other | Admitting: Occupational Therapy

## 2012-09-14 DIAGNOSIS — R5381 Other malaise: Secondary | ICD-10-CM | POA: Insufficient documentation

## 2012-09-14 DIAGNOSIS — R262 Difficulty in walking, not elsewhere classified: Secondary | ICD-10-CM | POA: Insufficient documentation

## 2012-09-14 DIAGNOSIS — M25619 Stiffness of unspecified shoulder, not elsewhere classified: Secondary | ICD-10-CM | POA: Insufficient documentation

## 2012-09-14 DIAGNOSIS — Z5189 Encounter for other specified aftercare: Secondary | ICD-10-CM | POA: Insufficient documentation

## 2012-09-18 ENCOUNTER — Ambulatory Visit: Payer: Medicare Other | Admitting: Occupational Therapy

## 2012-09-20 ENCOUNTER — Ambulatory Visit: Payer: Medicare Other | Admitting: Occupational Therapy

## 2012-09-26 ENCOUNTER — Ambulatory Visit: Payer: Medicare Other | Admitting: Occupational Therapy

## 2012-09-28 ENCOUNTER — Ambulatory Visit: Payer: Medicare Other | Admitting: Occupational Therapy

## 2012-10-02 ENCOUNTER — Ambulatory Visit: Payer: Medicare Other | Admitting: Occupational Therapy

## 2012-10-05 ENCOUNTER — Ambulatory Visit: Payer: Medicare Other | Admitting: Occupational Therapy

## 2012-10-10 ENCOUNTER — Ambulatory Visit: Payer: Medicare Other | Admitting: Occupational Therapy

## 2012-10-12 ENCOUNTER — Ambulatory Visit: Payer: Medicare Other | Admitting: Occupational Therapy

## 2012-10-16 ENCOUNTER — Ambulatory Visit: Payer: Medicare Other | Attending: Physical Medicine & Rehabilitation | Admitting: Occupational Therapy

## 2012-10-16 DIAGNOSIS — R4189 Other symptoms and signs involving cognitive functions and awareness: Secondary | ICD-10-CM | POA: Insufficient documentation

## 2012-10-16 DIAGNOSIS — R5381 Other malaise: Secondary | ICD-10-CM | POA: Insufficient documentation

## 2012-10-16 DIAGNOSIS — Z5189 Encounter for other specified aftercare: Secondary | ICD-10-CM | POA: Insufficient documentation

## 2012-10-16 DIAGNOSIS — R262 Difficulty in walking, not elsewhere classified: Secondary | ICD-10-CM | POA: Insufficient documentation

## 2012-10-18 ENCOUNTER — Ambulatory Visit: Payer: Medicare Other | Admitting: Occupational Therapy

## 2012-10-23 ENCOUNTER — Ambulatory Visit: Payer: Medicare Other | Admitting: Occupational Therapy

## 2012-10-25 ENCOUNTER — Encounter: Payer: Medicare Other | Admitting: Occupational Therapy

## 2012-10-25 ENCOUNTER — Ambulatory Visit: Payer: Medicare Other | Admitting: Occupational Therapy

## 2012-11-02 ENCOUNTER — Ambulatory Visit: Payer: Medicare Other | Admitting: Occupational Therapy

## 2012-11-08 ENCOUNTER — Ambulatory Visit: Payer: Medicare Other | Admitting: Occupational Therapy

## 2013-07-16 ENCOUNTER — Encounter: Payer: Self-pay | Admitting: Cardiology

## 2013-07-18 ENCOUNTER — Encounter: Payer: Self-pay | Admitting: Cardiology

## 2013-07-18 ENCOUNTER — Encounter (INDEPENDENT_AMBULATORY_CARE_PROVIDER_SITE_OTHER): Payer: Self-pay

## 2013-07-18 ENCOUNTER — Ambulatory Visit (INDEPENDENT_AMBULATORY_CARE_PROVIDER_SITE_OTHER): Payer: Medicare Other | Admitting: Cardiology

## 2013-07-18 VITALS — BP 135/70 | HR 85 | Ht 64.0 in | Wt 137.0 lb

## 2013-07-18 DIAGNOSIS — E785 Hyperlipidemia, unspecified: Secondary | ICD-10-CM

## 2013-07-18 DIAGNOSIS — I2510 Atherosclerotic heart disease of native coronary artery without angina pectoris: Secondary | ICD-10-CM

## 2013-07-18 DIAGNOSIS — I10 Essential (primary) hypertension: Secondary | ICD-10-CM

## 2013-07-18 HISTORY — DX: Essential (primary) hypertension: I10

## 2013-07-18 HISTORY — DX: Atherosclerotic heart disease of native coronary artery without angina pectoris: I25.10

## 2013-07-18 NOTE — Progress Notes (Addendum)
  [address] [place], [place]  [address] Phone: [phone] Fax:  [phone]  Date:  07/18/2013   ID:  Rodney Riley, DOB [birth year], MRN [id number]  PCP:  Pearla Dubonnet, MD  Cardiologist:  Armanda Magic, MD     History of Present Illness: Rodney Riley is a 67 y.o. male with a history of ASCAD, HTN and dyslipidemia.  He is doing well.  He denies any chest pain, SOB, DOE, LE edema, dizziness, palpitations or syncope.   Wt Readings from Last 3 Encounters:  07/18/13 137 lb (62.143 kg)  08/08/12 133 lb (60.328 kg)  07/18/12 142 lb 6.7 oz (64.6 kg)     Past Medical History  Diagnosis Date  . Hypertension   . Low back pain   . Hyperlipidemia   . Coronary artery disease     s/p CABG  . Erectile dysfunction     Current Outpatient Prescriptions  Medication Sig Dispense Refill  . aspirin 325 MG tablet Take 325 mg by mouth daily.      . Cholecalciferol (VITAMIN D) 2000 UNITS CAPS Take 1 capsule by mouth daily.       . Multiple Vitamin (MULTIVITAMIN WITH MINERALS) TABS Take 1 tablet by mouth daily.      . quinapril (ACCUPRIL) 20 MG tablet Take 10 mg by mouth daily.      . simvastatin (ZOCOR) 80 MG tablet Take 80 mg by mouth at bedtime.       No current facility-administered medications for this visit.    Allergies:   No Known Allergies  Social History:  The patient  reports that he has never smoked. He does not have any smokeless tobacco history on file. He reports that he drinks about 0.6 ounces of alcohol per week. He reports that he does not use illicit drugs.   Family History:  The patient's family history includes Alzheimer's disease in his mother; COPD in his father; Cancer in his father; Diabetes in his mother; Heart disease in his brother, father, and mother.   ROS:  Please see the history of present illness.      All other systems reviewed and negative.   PHYSICAL EXAM: VS:  BP 135/70  Pulse 85  Ht 5\' 4"  (1.626 m)  Wt 137 lb (62.143 kg)   BMI 23.50 kg/m2 Well nourished, well developed, in no acute distress HEENT: normal Neck: no JVD Cardiac:  normal S1, S2; RRR; no murmur Lungs:  clear to auscultation bilaterally, no wheezing, rhonchi or rales Abd: soft, nontender, no hepatomegaly Ext: no edema Skin: warm and dry Neuro:  CNs 2-12 intact, no focal abnormalities noted  EKG:  NSR with no ST changes     ASSESSMENT AND PLAN:  1. ASCAD with no angina  - continue ASA 2. HTN well controlled  - continue Quinapril 3. Dyslipidemia - LDL at goal   - continue simvastatin  Followup with me in 1 year  Signed, Armanda Magic, MD 07/18/2013 8:47 AM

## 2013-07-18 NOTE — Patient Instructions (Signed)
Your physician recommends that you continue on your current medications as directed. Please refer to the Current Medication list given to you today.  Your physician wants you to follow-up in: One Year with Dr. Sherlyn Lick will receive a reminder letter in the mail two months in advance. If you don't receive a letter, please call our office to schedule the follow-up appointment.

## 2013-09-26 IMAGING — CT CT SHOULDER*L* W/O CM
2 of 3 series · 7 of 33 positions shown, 8 images · non-contrast
Comparison: Radiographs dated 07/12/2012

***ADDENDUM*** CREATED: 07/18/2012 [DATE]

3-DIMENSIONAL CT IMAGE RENDERING AT INDEPENDENT WORKSTATION:
TECHNIQUE: 3-dimensional CT images were rendered by post-
processing of the original CT data at independent workstation.  The
3-dimensional CT images were interpreted, and findings are reported
in the accompanying complete CT report for this study.
3-D images were created and better demonstrate the relationship of
the multiple scapular fracture fragments one another including the
comminuted fracture of the superior aspect of the glenoid.
CLINICAL DATA: Left scapular fracture.  Multiple rib fractures.
CT OF THE LEFT SHOULDER WITHOUT CONTRAST
TECHNIQUE: Multidetector CT imaging was performed according to the
standard protocol. Multiplanar CT image reconstructions were also
generated.

[Series 3: shoulder s t · axial · 0.29mm/px · z∈[-127,-12]mm · 4 of 35 slices shown, 5 images]
[im 6/35  soft-tissue]
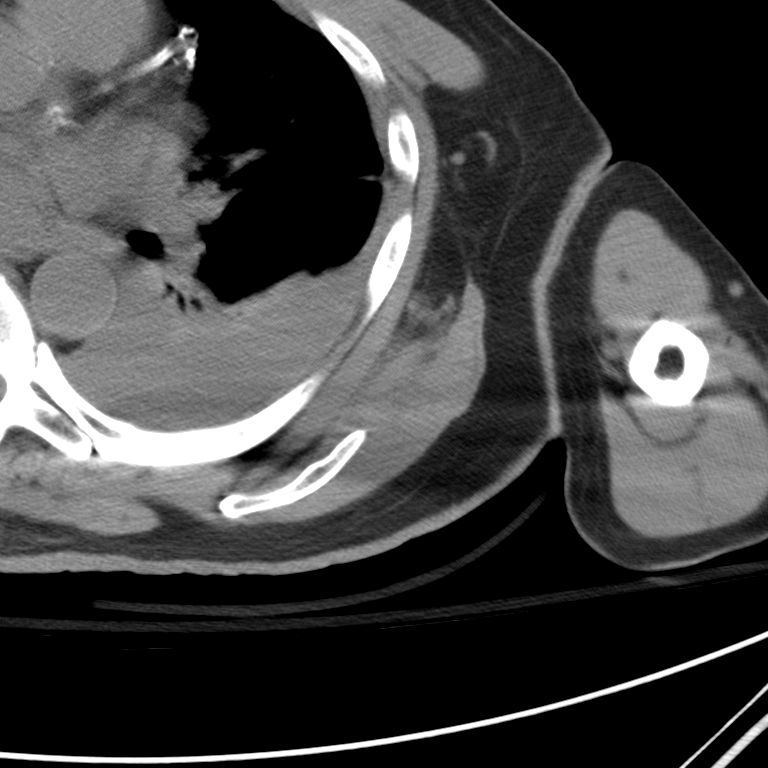
[im 6/35  bone]
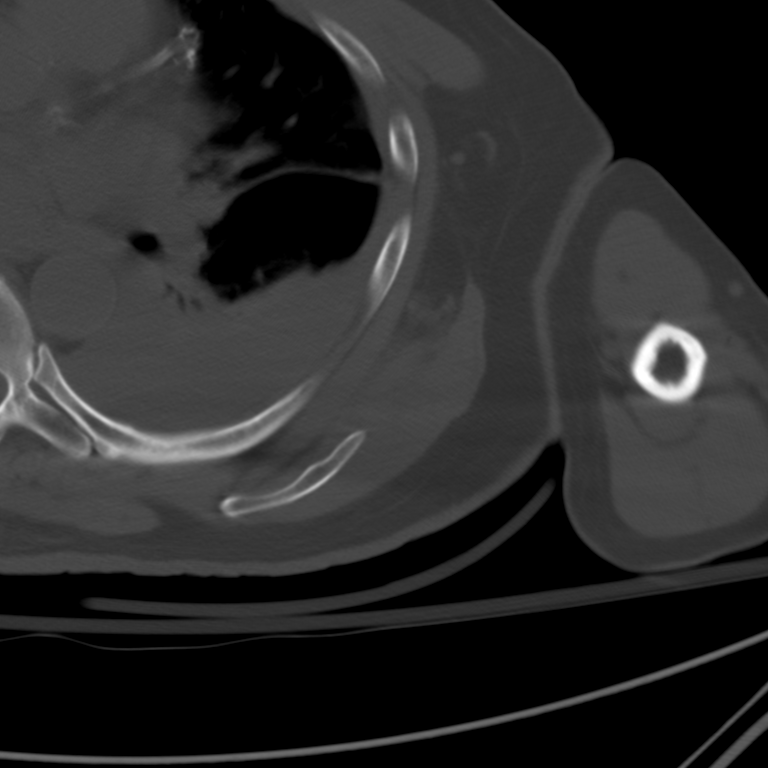
[im 14/35  bone]
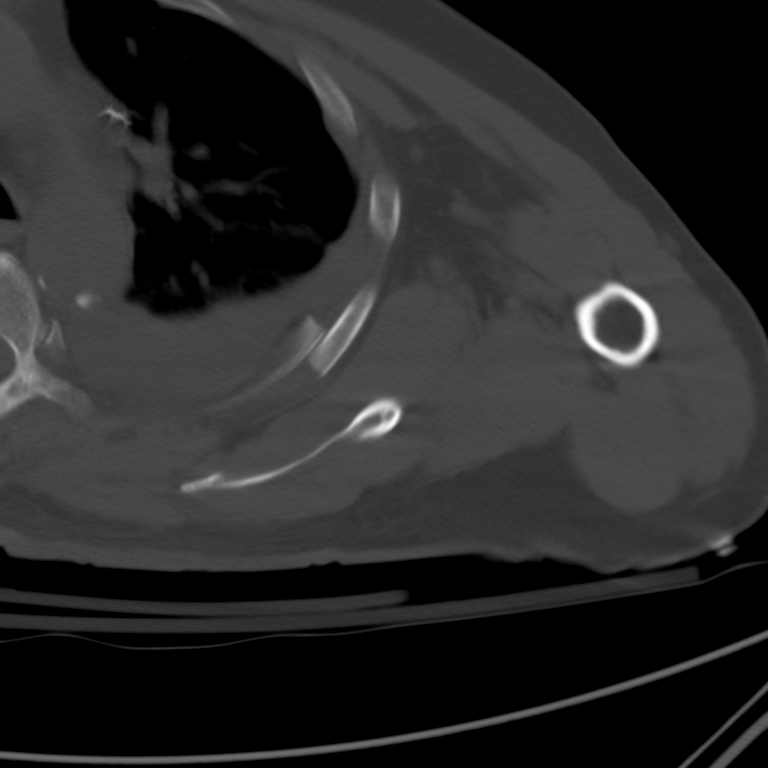
[im 21/35  bone]
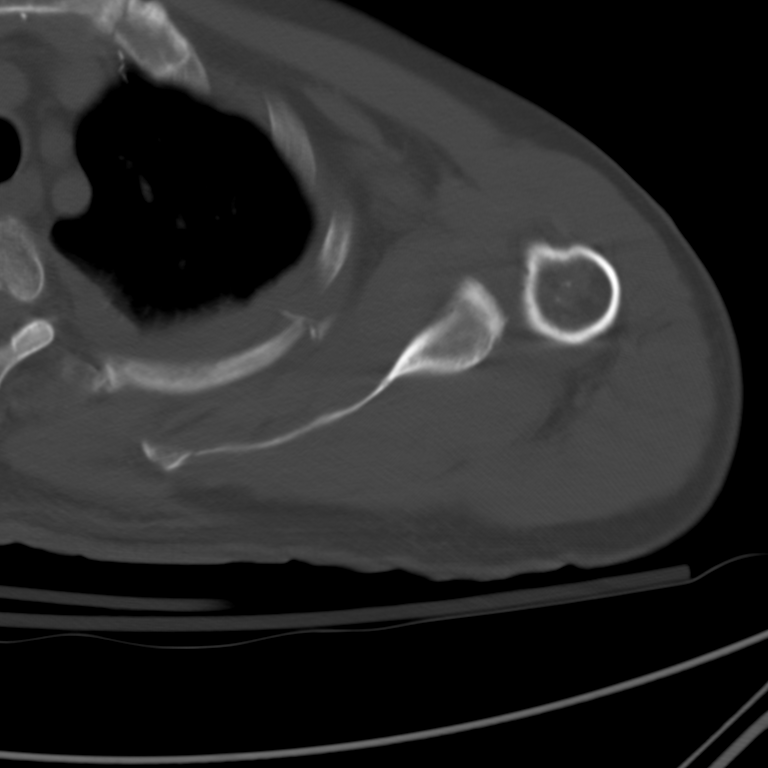
[im 29/35  bone]
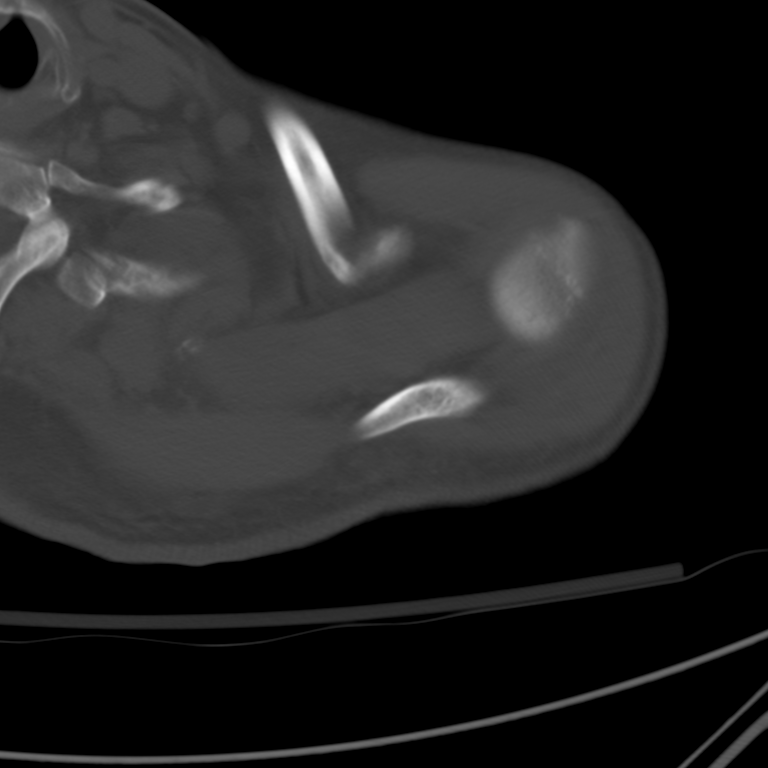

[coronals · coronal · 0.29mm/px · 3 of 78 slices shown]
[im 16/78  bone]
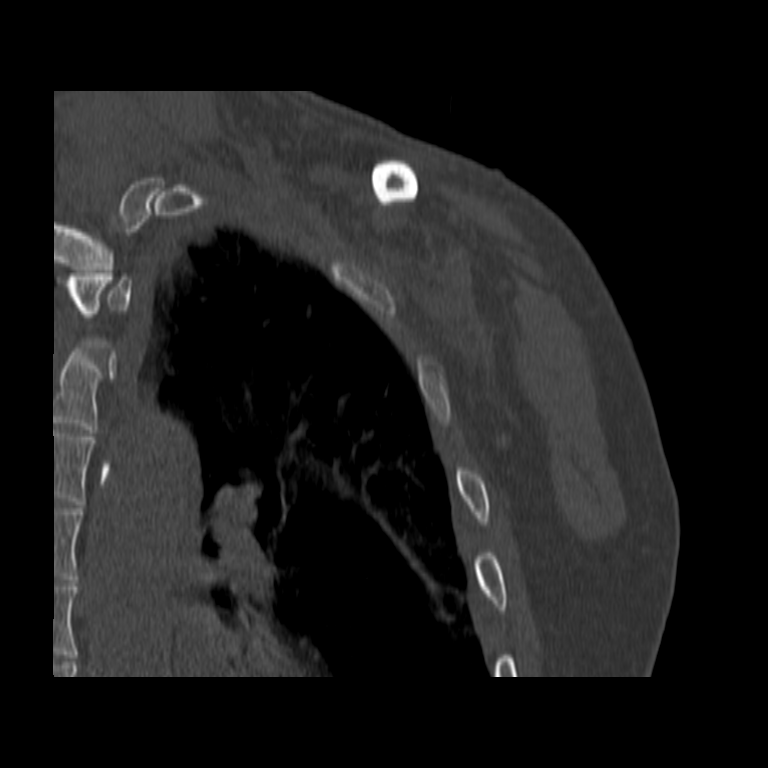
[im 31/78  bone]
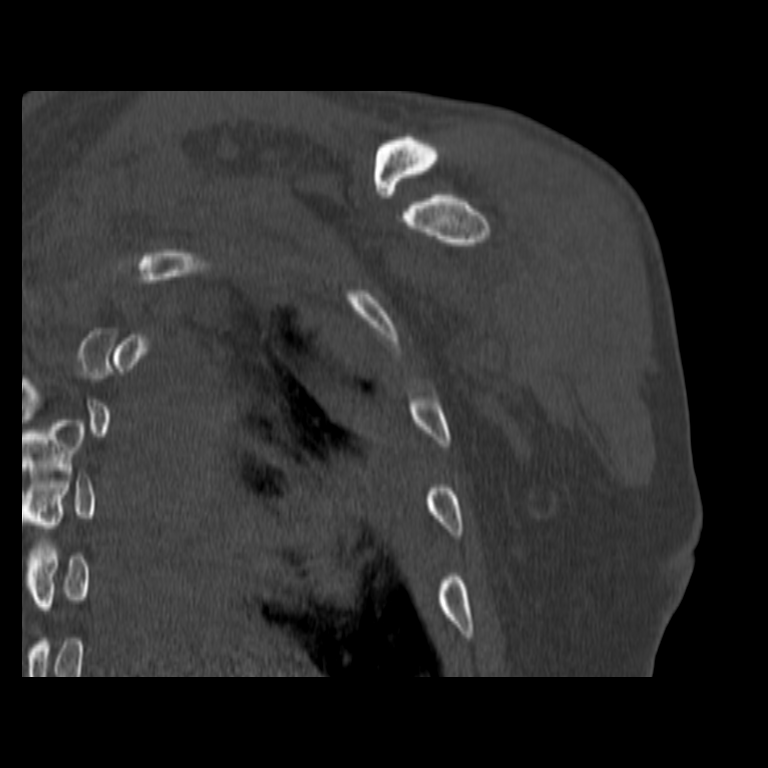
[im 47/78  bone]
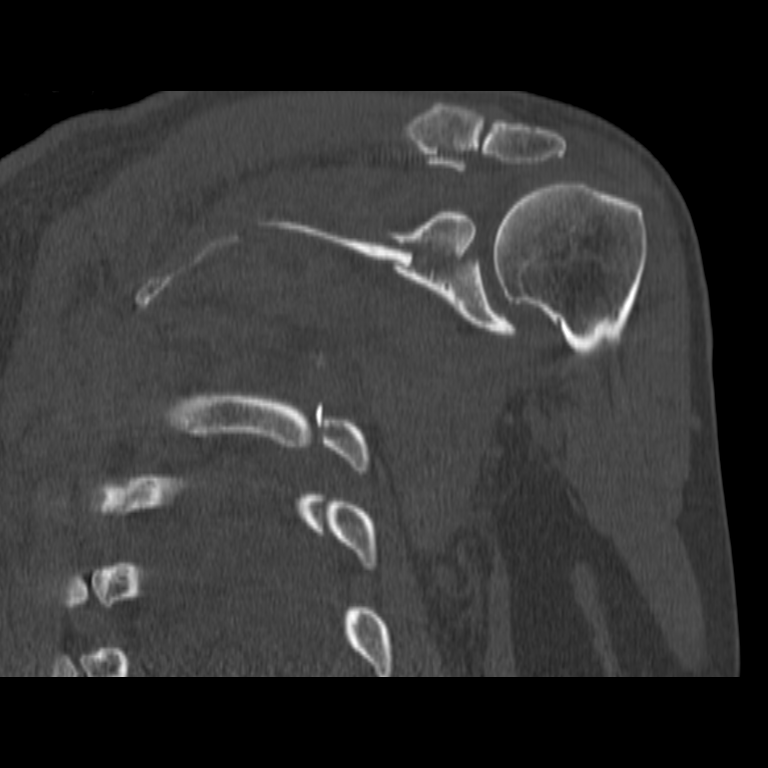

[7 of 33 positions shown; findings below may reference images not displayed]

FINDINGS: There is a comminuted slightly impacted fracture of the
undersurface of the distal clavicle.  The distal clavicle is also
deformed secondary to a prior well healed fracture.

There is a fracture of the base of the acromion with 7 mm of
distraction.  There is also a fracture through the base of the
coracoid with 6 mm of distraction.  This fracture extends into the
superior aspect of the glenoid.  The articular surface of the
glenoid is fragmented and slightly impacted.  Linear fractures
extend along the superior aspect of the scapula to the medial
border.

Note is made of fractures of the left second through 6th ribs and
left eighth rib.  There is a left pleural effusion with compressive
atelectasis in the left lower lobe.

The proximal humerus is intact.  No dislocation.
IMPRESSION: 1.  Comminuted fracture of the scapula involving the bases of the
coracoid and acromial processes as well as the superior aspect of
the glenoid extending into the body of the scapula.
2.  Fracture of the distal clavicle.
3.  Multiple left rib fractures.

## 2013-09-27 IMAGING — CR DG ABDOMEN 1V
1 series · 1 of 1 positions shown · non-contrast
Comparison: None.

CLINICAL DATA: Constipation

ABDOMEN - 1 VIEW

[x abdomen supine]
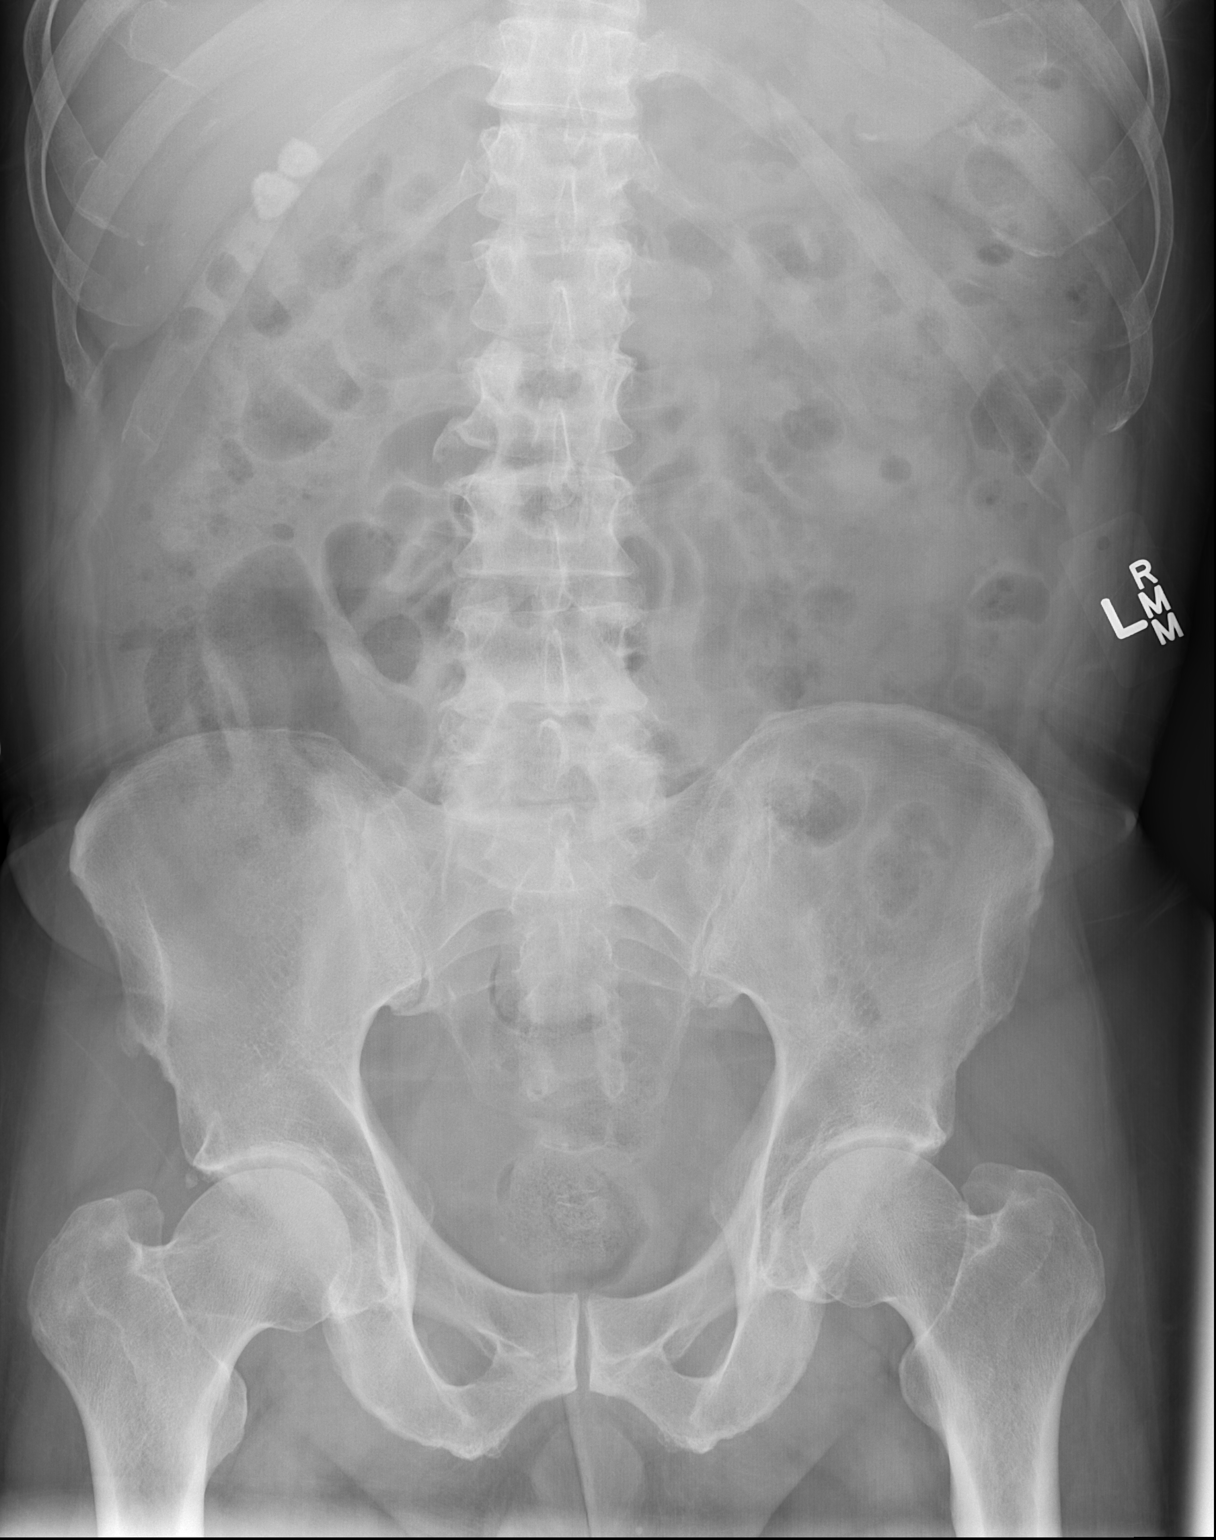

[1 of 1 positions shown; findings below may reference images not displayed]

FINDINGS: No evidence of dilated loops of large or small bowel.
Small amounts stool in the rectum.  Gallstones in the gallbladder
noted.  There is gas scattered throughout the large and small
bowel. Posterior left rib fractures again noted.
IMPRESSION: 1. No evidence of bowel obstruction.

2. No significant stool burden to suggest constipation.

3. Cholelithiasis.

4. Rib fractures on the left.

## 2014-07-22 ENCOUNTER — Encounter: Payer: Self-pay | Admitting: Cardiology

## 2014-07-22 ENCOUNTER — Ambulatory Visit (INDEPENDENT_AMBULATORY_CARE_PROVIDER_SITE_OTHER): Payer: Medicare Other | Admitting: Cardiology

## 2014-07-22 VITALS — BP 132/74 | HR 68 | Ht 64.0 in | Wt 142.8 lb

## 2014-07-22 DIAGNOSIS — I2510 Atherosclerotic heart disease of native coronary artery without angina pectoris: Secondary | ICD-10-CM

## 2014-07-22 DIAGNOSIS — E785 Hyperlipidemia, unspecified: Secondary | ICD-10-CM

## 2014-07-22 DIAGNOSIS — I10 Essential (primary) hypertension: Secondary | ICD-10-CM

## 2014-07-22 DIAGNOSIS — I2583 Coronary atherosclerosis due to lipid rich plaque: Principal | ICD-10-CM

## 2014-07-22 MED ORDER — ATORVASTATIN CALCIUM 40 MG PO TABS: 40.0000 mg | ORAL_TABLET | Freq: Every day | ORAL | Status: DC

## 2014-07-22 NOTE — Progress Notes (Signed)
  [address] [place], [place]  [address] Phone: [phone] Fax:  [phone]) [phone]  Date:  07/22/2014   ID:  Rodney Riley, DOB 05/10/1946, MRN [id number]  PCP:  Pearla DubonnetGATES,ROBERT NEVILL, MD  Cardiologist:  Armanda Magicraci Turner, MD    History of Present Illness: Rodney Riley is a 68 y.o. male with a history of ASCAD, HTN and dyslipidemia. He is doing well. He denies any chest pain, SOB, DOE, LE edema, dizziness, palpitations or syncope.    Wt Readings from Last 3 Encounters:  07/22/14 142 lb 12.8 oz (64.774 kg)  07/18/13 137 lb (62.143 kg)  08/08/12 133 lb (60.328 kg)     Past Medical History  Diagnosis Date  . Hypertension   . Low back pain   . Hyperlipidemia   . Coronary artery disease     s/p CABG  . Erectile dysfunction     Current Outpatient Prescriptions  Medication Sig Dispense Refill  . aspirin 325 MG tablet Take 325 mg by mouth daily.    . cholecalciferol (VITAMIN D) 1000 UNITS tablet Take 1,000 Units by mouth daily.    . Multiple Vitamin (MULTIVITAMIN WITH MINERALS) TABS Take 1 tablet by mouth daily.    . Omega-3 Fatty Acids (FISH OIL) 1000 MG CAPS Take 3 capsules by mouth 3 (three) times daily after meals.    . simvastatin (ZOCOR) 40 MG tablet Take 40 mg by mouth at bedtime.    . quinapril (ACCUPRIL) 20 MG tablet Take 10 mg by mouth daily.     No current facility-administered medications for this visit.    Allergies:   No Known Allergies  Social History:  The patient  reports that he has never smoked. He does not have any smokeless tobacco history on file. He reports that he drinks about 0.6 oz of alcohol per week. He reports that he does not use illicit drugs.   Family History:  The patient's family history includes Alzheimer's disease in his mother; COPD in his father; Cancer in his father; Diabetes in his mother; Heart disease in his brother, father, and mother.   ROS:  Please see the history of present illness.      All other systems reviewed and  negative.   PHYSICAL EXAM: VS:  BP 132/74 mmHg  Pulse 68  Ht 5\' 4"  (1.626 m)  Wt 142 lb 12.8 oz (64.774 kg)  BMI 24.50 kg/m2 Well nourished, well developed, in no acute distress HEENT: normal Neck: no JVD Cardiac:  normal S1, S2; RRR; no murmur Lungs:  clear to auscultation bilaterally, no wheezing, rhonchi or rales Abd: soft, nontender, no hepatomegaly Ext: no edema Skin: warm and dry Neuro:  CNs 2-12 intact, no focal abnormalities noted  EKG:     NSR with LAE and no ST changes  ASSESSMENT AND PLAN:  1. ASCAD with no angina - continue ASA 2. HTN well controlled - continue Quinapril 3. Dyslipidemia - LDL at goal - continue simvastatin - his last LDL was 91.  His goal is <70.  His simvastatin was cut back to simvastatin 40mg  from 80mg  in June by his PCP.       - since he is not at goal I have recommended stop simvastatin and start Lipitor 40mg  daily and I will recheck his FLP and ALT in 6 weeks         Followup with me in 1 year    Signed, Armanda Magicraci Turner, MD [facility] 07/22/2014 10:29 AM

## 2014-07-22 NOTE — Patient Instructions (Addendum)
Your physician has recommended you make the following change in your medication:  1) STOP Simvastatin 2) START Lipitor 40 mg daily  Your physician recommends that you return for lab work in 6 weeks: Fasting lipid panel, ALT  Your physician wants you to follow-up in: 6 months with Dr. Mayford Knifeurner.  You will receive a reminder letter in the mail two months in advance. If you don't receive a letter, please call our office to schedule the follow-up appointment.

## 2014-08-14 ENCOUNTER — Encounter: Payer: Self-pay | Admitting: Cardiology

## 2014-09-03 ENCOUNTER — Other Ambulatory Visit: Payer: Medicare Other

## 2014-09-25 ENCOUNTER — Other Ambulatory Visit (INDEPENDENT_AMBULATORY_CARE_PROVIDER_SITE_OTHER): Payer: Medicare Other | Admitting: *Deleted

## 2014-09-25 DIAGNOSIS — I2510 Atherosclerotic heart disease of native coronary artery without angina pectoris: Secondary | ICD-10-CM

## 2014-09-25 DIAGNOSIS — E785 Hyperlipidemia, unspecified: Secondary | ICD-10-CM

## 2014-09-25 DIAGNOSIS — I2583 Coronary atherosclerosis due to lipid rich plaque: Principal | ICD-10-CM

## 2014-09-25 LAB — LIPID PANEL
Cholesterol: 128 mg/dL (ref 0–200)
HDL: 43.5 mg/dL (ref >39.00)
LDL Cholesterol: 67 mg/dL (ref 0–99)
NonHDL: 84.5
Total CHOL/HDL Ratio: 3
Triglycerides: 88 mg/dL (ref 0.0–149.0)
VLDL: 17.6 mg/dL (ref 0.0–40.0)

## 2014-09-25 LAB — ALT: ALT: 24 U/L (ref 0–53)

## 2014-10-14 ENCOUNTER — Encounter: Payer: Self-pay | Admitting: Cardiology

## 2014-10-17 ENCOUNTER — Other Ambulatory Visit: Payer: Self-pay

## 2014-10-17 MED ORDER — ATORVASTATIN CALCIUM 40 MG PO TABS: 40.0000 mg | ORAL_TABLET | Freq: Every day | ORAL | Status: DC

## 2014-11-22 ENCOUNTER — Encounter: Payer: Self-pay | Admitting: Cardiology

## 2015-03-10 ENCOUNTER — Ambulatory Visit (INDEPENDENT_AMBULATORY_CARE_PROVIDER_SITE_OTHER): Payer: Medicare Other | Admitting: Cardiology

## 2015-03-10 ENCOUNTER — Encounter: Payer: Self-pay | Admitting: Cardiology

## 2015-03-10 VITALS — BP 110/62 | HR 77 | Ht 64.0 in | Wt 139.2 lb

## 2015-03-10 DIAGNOSIS — E785 Hyperlipidemia, unspecified: Secondary | ICD-10-CM | POA: Diagnosis not present

## 2015-03-10 DIAGNOSIS — I2510 Atherosclerotic heart disease of native coronary artery without angina pectoris: Secondary | ICD-10-CM

## 2015-03-10 DIAGNOSIS — I10 Essential (primary) hypertension: Secondary | ICD-10-CM

## 2015-03-10 DIAGNOSIS — I2583 Coronary atherosclerosis due to lipid rich plaque: Principal | ICD-10-CM

## 2015-03-10 MED ORDER — ASPIRIN 81 MG PO TABS: 325.0000 mg | ORAL_TABLET | Freq: Every day | ORAL | Status: DC

## 2015-03-10 NOTE — Progress Notes (Signed)
Cardiology Office Note   Date:  03/10/2015   ID:  [name], DOB [birth year], MRN [id number]  PCP:  Pearla Dubonnet, MD    Chief Complaint  Patient presents with  . Follow-up    CAD      History of Present Illness: Rodney Riley is a 69 y.o. male with a history of ASCAD, HTN and dyslipidemia. He is doing well. He denies any chest pain, SOB, DOE, LE edema, dizziness, palpitations or syncope.  He walks 2.5 miles 4-5 times weekly.     Past Medical History  Diagnosis Date  . Hypertension   . Low back pain   . Hyperlipidemia   . Coronary artery disease     s/p CABG  . Erectile dysfunction     Past Surgical History  Procedure Laterality Date  . Cardiac surgery    . Lumbar disc surgery    . Inguinal hernia repair Left   . Tonsillectomy    . Coronary artery bypass graft       Current Outpatient Prescriptions  Medication Sig Dispense Refill  . aspirin 325 MG tablet Take 325 mg by mouth daily.    Marland Kitchen atorvastatin (LIPITOR) 40 MG tablet Take 1 tablet (40 mg total) by mouth daily. 90 tablet 1  . cholecalciferol (VITAMIN D) 1000 UNITS tablet Take 1,000 Units by mouth daily.    . Multiple Vitamin (MULTIVITAMIN WITH MINERALS) TABS Take 1 tablet by mouth daily.    . Omega-3 Fatty Acids (FISH OIL) 1000 MG CAPS Take 3 capsules by mouth 3 (three) times daily after meals.    . quinapril (ACCUPRIL) 10 MG tablet Take 10 mg by mouth daily.     No current facility-administered medications for this visit.    Allergies:   Review of patient's allergies indicates no known allergies.    Social History:  The patient  reports that he has never smoked. He does not have any smokeless tobacco history on file. He reports that he drinks about 0.6 oz of alcohol per week. He reports that he does not use illicit drugs.   Family History:  The patient's family history includes Alzheimer's disease in his mother; COPD in his father; Cancer in his father; Diabetes in  his mother; Heart disease in his brother, father, and mother.    ROS:  Please see the history of present illness.   Otherwise, review of systems are positive for none.   All other systems are reviewed and negative.    PHYSICAL EXAM: VS:  BP 110/62 mmHg  Pulse 77  Ht  (1.626 m)  Wt 139 lb 3.2 oz (63.141 kg)  BMI 23.88 kg/m2  SpO2 94% , BMI Body mass index is 23.88 kg/(m^2). GEN: Well nourished, well developed, in no acute distress HEENT: normal Neck: no JVD, carotid bruits, or masses Cardiac: RRR; no murmurs, rubs, or gallops,no edema  Respiratory:  clear to auscultation bilaterally, normal work of breathing GI: soft, nontender, nondistended, + BS MS: no deformity or atrophy Skin: warm and dry, no rash Neuro:  Strength and sensation are intact Psych: euthymic mood, full affect   EKG:  EKG is not ordered today.    Recent Labs: 09/25/2014: ALT 24    Lipid Panel    Component Value Date/Time   CHOL 128 09/25/2014 0758   TRIG 88.0 09/25/2014 0758   HDL 43.50 09/25/2014 0758   CHOLHDL 3 09/25/2014  0758   VLDL 17.6 09/25/2014 0758   LDLCALC 67 09/25/2014 0758      Wt Readings from Last 3 Encounters:  03/10/15 139 lb 3.2 oz (63.141 kg)  07/22/14 142 lb 12.8 oz (64.774 kg)  07/18/13 137 lb (62.143 kg)    ASSESSMENT AND PLAN:  1. ASCAD with no angina - continue ASA and decrease to 81mg  daily 2. HTN well controlled - continue Quinapril 3. Dyslipidemia - LDL at goal - continue atorvastatin - his last LDL was 67. His goal is <70.Recheck FLP and ALT. He is tolerating the atorvastatin well.     Current medicines are reviewed at length with the patient today.  The patient does not have concerns regarding medicines.  The following changes have been made:  no change  Labs/ tests ordered today: See above Assessment and Plan No orders of the defined types were placed in this encounter.     Disposition:   FU with me in 1  year  Signed, Quintella ReichertURNER,Inna Tisdell R, MD  03/10/2015 3:23 PM    [facility] [address], [place], [place]  [address] Phone: [phone]; Fax: [phone]

## 2015-03-10 NOTE — Patient Instructions (Signed)
Medication Instructions:  Your physician has recommended you make the following change in your medication:  1) DECREASE ASPIRIN to 81 mg daily   Labwork: FASTING LABWORK on Tuesday, July 5.  Testing/Procedures: None  Follow-Up: Your physician wants you to follow-up in: 1 year with Dr. Mayford Knifeurner. You will receive a reminder letter in the mail two months in advance. If you don't receive a letter, please call our office to schedule the follow-up appointment.   Any Other Special Instructions Will Be Listed Below (If Applicable).

## 2015-03-10 NOTE — Addendum Note (Signed)
Addended by: Gunnar FusiKEMP, Aliyah Abeyta A on: 03/10/2015 04:58 PM   Modules accepted: Orders

## 2015-03-18 ENCOUNTER — Other Ambulatory Visit (INDEPENDENT_AMBULATORY_CARE_PROVIDER_SITE_OTHER): Payer: Medicare Other | Admitting: *Deleted

## 2015-03-18 DIAGNOSIS — E785 Hyperlipidemia, unspecified: Secondary | ICD-10-CM | POA: Diagnosis not present

## 2015-03-18 LAB — HEPATIC FUNCTION PANEL
ALT: 28 U/L (ref 0–53)
AST: 30 U/L (ref 0–37)
Albumin: 3.9 g/dL (ref 3.5–5.2)
Alkaline Phosphatase: 62 U/L (ref 39–117)
Bilirubin, Direct: 0.1 mg/dL (ref 0.0–0.3)
Total Bilirubin: 0.4 mg/dL (ref 0.2–1.2)
Total Protein: 6.7 g/dL (ref 6.0–8.3)

## 2015-03-18 LAB — LIPID PANEL
Cholesterol: 120 mg/dL (ref 0–200)
HDL: 43.9 mg/dL (ref >39.00)
LDL Cholesterol: 69 mg/dL (ref 0–99)
NonHDL: 76.1
Total CHOL/HDL Ratio: 3
Triglycerides: 38 mg/dL (ref 0.0–149.0)
VLDL: 7.6 mg/dL (ref 0.0–40.0)

## 2015-04-18 ENCOUNTER — Encounter: Payer: Self-pay | Admitting: Cardiology

## 2015-04-18 ENCOUNTER — Other Ambulatory Visit: Payer: Self-pay

## 2015-04-18 MED ORDER — ATORVASTATIN CALCIUM 40 MG PO TABS: 40.0000 mg | ORAL_TABLET | Freq: Every day | ORAL | Status: DC

## 2015-07-23 ENCOUNTER — Encounter: Payer: Self-pay | Admitting: Cardiology

## 2015-07-24 ENCOUNTER — Other Ambulatory Visit (HOSPITAL_COMMUNITY): Payer: Self-pay

## 2015-07-24 DIAGNOSIS — E785 Hyperlipidemia, unspecified: Secondary | ICD-10-CM

## 2015-08-27 ENCOUNTER — Other Ambulatory Visit (INDEPENDENT_AMBULATORY_CARE_PROVIDER_SITE_OTHER): Payer: Medicare Other | Admitting: *Deleted

## 2015-08-27 DIAGNOSIS — E785 Hyperlipidemia, unspecified: Secondary | ICD-10-CM

## 2015-08-27 LAB — ALT: ALT: 28 U/L (ref 9–46)

## 2015-08-27 LAB — LIPID PANEL
Cholesterol: 111 mg/dL — ABNORMAL LOW (ref 125–200)
HDL: 45 mg/dL (ref >=40)
LDL Cholesterol: 56 mg/dL (ref <130)
Total CHOL/HDL Ratio: 2.5 Ratio (ref <=5.0)
Triglycerides: 49 mg/dL (ref <150)
VLDL: 10 mg/dL (ref <30)

## 2015-08-27 NOTE — Addendum Note (Signed)
Addended by: Tonita PhoenixBOWDEN, ROBIN K on: 08/27/2015 08:56 AM   Modules accepted: Orders

## 2015-08-27 NOTE — Addendum Note (Signed)
Addended by: BOWDEN, ROBIN K on: 08/27/2015 08:56 AM   Modules accepted: Orders  

## 2015-08-27 NOTE — Addendum Note (Signed)
Addended by: Michio Thier K on: 08/27/2015 08:56 AM   Modules accepted: Orders  

## 2016-02-03 ENCOUNTER — Encounter: Payer: Self-pay | Admitting: Cardiology

## 2016-02-23 ENCOUNTER — Other Ambulatory Visit: Payer: Self-pay | Admitting: Cardiology

## 2016-04-19 ENCOUNTER — Encounter: Payer: Self-pay | Admitting: *Deleted

## 2016-04-28 ENCOUNTER — Encounter: Payer: Self-pay | Admitting: Cardiology

## 2016-05-03 ENCOUNTER — Ambulatory Visit: Payer: Medicare Other | Admitting: Cardiology

## 2016-05-06 ENCOUNTER — Other Ambulatory Visit: Payer: Self-pay | Admitting: Gastroenterology

## 2016-05-21 ENCOUNTER — Other Ambulatory Visit: Payer: Self-pay | Admitting: Cardiology

## 2016-06-01 ENCOUNTER — Encounter (HOSPITAL_COMMUNITY): Payer: Self-pay | Admitting: *Deleted

## 2016-06-08 ENCOUNTER — Ambulatory Visit (HOSPITAL_COMMUNITY): Payer: Medicare Other | Admitting: Anesthesiology

## 2016-06-08 ENCOUNTER — Encounter (HOSPITAL_COMMUNITY): Payer: Self-pay | Admitting: *Deleted

## 2016-06-08 ENCOUNTER — Ambulatory Visit (HOSPITAL_COMMUNITY)
Admission: RE | Admit: 2016-06-08 | Discharge: 2016-06-08 | Disposition: A | Discharge status: Home / Self Care | Payer: Medicare Other | Source: Ambulatory Visit | Attending: Gastroenterology | Admitting: Gastroenterology

## 2016-06-08 ENCOUNTER — Encounter (HOSPITAL_COMMUNITY)
Admission: RE | Disposition: A | Discharge status: Home / Self Care | Payer: Self-pay | Source: Ambulatory Visit | Attending: Gastroenterology

## 2016-06-08 DIAGNOSIS — K5730 Diverticulosis of large intestine without perforation or abscess without bleeding: Secondary | ICD-10-CM | POA: Insufficient documentation

## 2016-06-08 DIAGNOSIS — Z7982 Long term (current) use of aspirin: Secondary | ICD-10-CM | POA: Insufficient documentation

## 2016-06-08 DIAGNOSIS — I2510 Atherosclerotic heart disease of native coronary artery without angina pectoris: Secondary | ICD-10-CM | POA: Diagnosis not present

## 2016-06-08 DIAGNOSIS — Z7984 Long term (current) use of oral hypoglycemic drugs: Secondary | ICD-10-CM | POA: Diagnosis not present

## 2016-06-08 DIAGNOSIS — I10 Essential (primary) hypertension: Secondary | ICD-10-CM | POA: Diagnosis not present

## 2016-06-08 DIAGNOSIS — Z1211 Encounter for screening for malignant neoplasm of colon: Secondary | ICD-10-CM | POA: Insufficient documentation

## 2016-06-08 DIAGNOSIS — E7800 Pure hypercholesterolemia, unspecified: Secondary | ICD-10-CM | POA: Diagnosis not present

## 2016-06-08 DIAGNOSIS — Z951 Presence of aortocoronary bypass graft: Secondary | ICD-10-CM | POA: Insufficient documentation

## 2016-06-08 DIAGNOSIS — Z79899 Other long term (current) drug therapy: Secondary | ICD-10-CM | POA: Diagnosis not present

## 2016-06-08 HISTORY — PX: COLONOSCOPY WITH PROPOFOL: SHX5780

## 2016-06-08 HISTORY — DX: Unspecified osteoarthritis, unspecified site: M19.90

## 2016-06-08 HISTORY — DX: Prediabetes: R73.03

## 2016-06-08 HISTORY — DX: Other specified postprocedural states: Z98.890

## 2016-06-08 HISTORY — DX: Nausea with vomiting, unspecified: R11.2

## 2016-06-08 SURGERY — COLONOSCOPY WITH PROPOFOL
Anesthesia: General

## 2016-06-08 MED ORDER — SODIUM CHLORIDE 0.9 % IV SOLN: INTRAVENOUS | Status: DC

## 2016-06-08 MED ORDER — LIDOCAINE 2% (20 MG/ML) 5 ML SYRINGE
INTRAMUSCULAR | Status: AC
  Filled 2016-06-08: qty 5

## 2016-06-08 MED ORDER — LACTATED RINGERS IV SOLN
INTRAVENOUS | Status: DC
  Administered 2016-06-08: 12:00:00 via INTRAVENOUS

## 2016-06-08 MED ORDER — PROPOFOL 10 MG/ML IV BOLUS
INTRAVENOUS | Status: AC
  Filled 2016-06-08: qty 60

## 2016-06-08 MED ORDER — PROPOFOL 500 MG/50ML IV EMUL
INTRAVENOUS | Status: DC | PRN
  Administered 2016-06-08: 125 ug/kg/min via INTRAVENOUS

## 2016-06-08 MED ORDER — PROPOFOL 10 MG/ML IV BOLUS
INTRAVENOUS | Status: DC | PRN
  Administered 2016-06-08 (×2): 20 mg via INTRAVENOUS

## 2016-06-08 MED ORDER — LIDOCAINE 2% (20 MG/ML) 5 ML SYRINGE
INTRAMUSCULAR | Status: DC | PRN
  Administered 2016-06-08: 40 mg via INTRAVENOUS

## 2016-06-08 SURGICAL SUPPLY — 21 items

## 2016-06-08 NOTE — Anesthesia Preprocedure Evaluation (Signed)
Anesthesia Evaluation  Patient identified by MRN, date of birth, ID band Patient awake    Reviewed: Allergy & Precautions, NPO status , Patient's Chart, lab work & pertinent test results  History of Anesthesia Complications (+) PONV and history of anesthetic complications  Airway Mallampati: II  TM Distance: >3 FB Neck ROM: Full    Dental no notable dental hx.    Pulmonary neg pulmonary ROS,    Pulmonary exam normal breath sounds clear to auscultation       Cardiovascular hypertension, + CAD  Normal cardiovascular exam Rhythm:Regular Rate:Normal     Neuro/Psych negative neurological ROS  negative psych ROS   GI/Hepatic negative GI ROS, Neg liver ROS,   Endo/Other  negative endocrine ROS  Renal/GU negative Renal ROS  negative genitourinary   Musculoskeletal  (+) Arthritis ,   Abdominal   Peds negative pediatric ROS (+)  Hematology negative hematology ROS (+)   Anesthesia Other Findings   Reproductive/Obstetrics negative OB ROS                             Anesthesia Physical Anesthesia Plan  ASA: III  Anesthesia Plan: General   Post-op Pain Management:    Induction: Intravenous  Airway Management Planned:   Additional Equipment:   Intra-op Plan:   Post-operative Plan: Extubation in OR  Informed Consent: I have reviewed the patients History and Physical, chart, labs and discussed the procedure including the risks, benefits and alternatives for the proposed anesthesia with the patient or authorized representative who has indicated his/her understanding and acceptance.   Dental advisory given  Plan Discussed with: CRNA  Anesthesia Plan Comments:         Anesthesia Quick Evaluation

## 2016-06-08 NOTE — Op Note (Signed)
[facility] Patient Name: Rodney Riley Procedure Date: 06/08/2016 MRN: [id number] Attending MD: Charolett Bumpers , MD Date of Birth: [birth year] CSN: [id number] Age: 70 Admit Type: Outpatient Procedure:                Colonoscopy Indications:              Screening for colorectal malignant neoplasm. Normal                            baseline screening colonoscopy was performed on                            08/27/2005. Positive cologuard stool DNA colon                            cancer screening test 03/31/2016. Providers:                Charolett Bumpers, MD, Omelia Blackwater RN, RN, Lorenda Ishihara, Technician, Delphia Grates, CRNA Referring MD:              Medicines:                Propofol per Anesthesia Complications:            No immediate complications. Estimated Blood Loss:     Estimated blood loss: none. Procedure:                Pre-Anesthesia Assessment:                           - Prior to the procedure, a History and Physical                            was performed, and patient medications and                            allergies were reviewed. The patient's tolerance of                            previous anesthesia was also reviewed. The risks                            and benefits of the procedure and the sedation                            options and risks were discussed with the patient.                            All questions were answered, and informed consent                            was obtained. Prior Anticoagulants: The patient has  taken aspirin, last dose was 1 day prior to                            procedure. ASA Grade Assessment: II - A patient                            with mild systemic disease. After reviewing the                            risks and benefits, the patient was deemed in                            satisfactory condition to undergo the procedure.         After obtaining informed consent, the colonoscope                            was passed under direct vision. Throughout the                            procedure, the patient's blood pressure, pulse, and                            oxygen saturations were monitored continuously. The                            EC-[id number] ([id number] scope was introduced through                            the anus and advanced to the the cecum, identified                            by appendiceal orifice and ileocecal valve. The                            colonoscopy was performed without difficulty. The                            patient tolerated the procedure well. The quality                            of the bowel preparation was good. The ileocecal                            valve, the appendiceal orifice and the rectum were                            photographed. Scope In: 11:58:59 AM Scope Out: 12:17:17 PM Scope Withdrawal Time: 0 hours 11 minutes 13 seconds  Total Procedure Duration: 0 hours 18 minutes 18 seconds  Findings:      The perianal and digital rectal examinations were normal.      The entire examined colon appeared normal. Sigmoid colon diverticulosis       was present. Impression:               -  The entire examined colon is normal.                           - No specimens collected. Moderate Sedation:      N/A- Per Anesthesia Care Recommendation:           - Patient has a contact number available for                            emergencies. The signs and symptoms of potential                            delayed complications were discussed with the                            patient. Return to normal activities tomorrow.                            Written discharge instructions were provided to the                            patient.                           - Repeat colonoscopy is not recommended for                            screening purposes.                           -  Resume previous diet.                           - Continue present medications. Procedure Code(s):        --- Professional ---                           Z6109, Colorectal cancer screening; colonoscopy on                            individual not meeting criteria for high risk Diagnosis Code(s):        --- Professional ---                           Z12.11, Encounter for screening for malignant                            neoplasm of colon CPT copyright 2016 American Medical Association. All rights reserved. The codes documented in this report are preliminary and upon coder review may  be revised to meet current compliance requirements. Danise Edge, MD Charolett Bumpers, MD 06/08/2016 12:25:23 PM This report has been signed electronically. Number of Addenda: 0

## 2016-06-08 NOTE — Anesthesia Procedure Notes (Signed)
Procedure Name: MAC Date/Time: 06/08/2016 11:51 AM Performed by: Delphia GratesHANDLER, Matheu Ploeger Pre-anesthesia Checklist: Patient identified, Emergency Drugs available, Suction available and Patient being monitored Oxygen Delivery Method: Simple face mask Placement Confirmation: positive ETCO2

## 2016-06-08 NOTE — Discharge Instructions (Signed)

## 2016-06-08 NOTE — H&P (Signed)
Procedure: Screening colonoscopy. Normal baseline screening colonoscopy was performed on 08/27/2005  History: The patient is a 70 year old male born 08/03/1946. On 03/31/2016 cologuard stool DNA colon cancer screening test was positive. He is scheduled to undergo a screening colonoscopy today  Past medical history: Coronary artery disease. Coronary artery bypass grafting surgery. Hypertension. Hypercholesterolemia. Lumbar laminectomy. Osteoarthritis. Traumatic subarachnoid hemorrhage and subdural hematoma secondary to a fall from a ladder in October 2013. Shingles was diagnosed in 2013. Left inguinal hernia repair. Tonsillectomy.  Medication allergies: Novocaine  Exam: The patient is alert and lying comfortably on the endoscopy stretcher. Abdomen is soft and nontender to palpation. Lungs are clear to auscultation. Cardiac exam reveals a regular rhythm.  Plan: Proceed with screening colonoscopy

## 2016-06-08 NOTE — Transfer of Care (Signed)
Immediate Anesthesia Transfer of Care Note  Patient: Rodney PoagLawrence E Kopka  Procedure(s) Performed: Procedure(s): COLONOSCOPY WITH PROPOFOL (N/A)  Patient Location: PACU and Endoscopy Unit  Anesthesia Type:MAC  Level of Consciousness: sedated and patient cooperative  Airway & Oxygen Therapy: Patient Spontanous Breathing and Patient connected to face mask oxygen  Post-op Assessment: Report given to RN and Post -op Vital signs reviewed and stable  Post vital signs: Reviewed and stable  Last Vitals:  Vitals:   06/08/16 1134  BP: (!) 143/64  Pulse: 74  Resp: 18  Temp: 36.4 C    Last Pain:  Vitals:   06/08/16 1150  TempSrc: Oral         Complications: No apparent anesthesia complications

## 2016-06-08 NOTE — Anesthesia Postprocedure Evaluation (Signed)
Anesthesia Post Note  Patient: Rodney Riley  Procedure(s) Performed: Procedure(s) (LRB): COLONOSCOPY WITH PROPOFOL (N/A)  Patient location during evaluation: PACU Anesthesia Type: MAC Level of consciousness: awake and alert Pain management: pain level controlled Vital Signs Assessment: post-procedure vital signs reviewed and stable Respiratory status: spontaneous breathing, nonlabored ventilation, respiratory function stable and patient connected to nasal cannula oxygen Cardiovascular status: stable and blood pressure returned to baseline Anesthetic complications: no    Last Vitals:  Vitals:   06/08/16 1230 06/08/16 1240  BP: (!) 114/51 116/63  Pulse: 72 66  Resp: 17 13  Temp:      Last Pain:  Vitals:   06/08/16 1222  TempSrc: Oral                 Chiron Campione J

## 2016-06-09 ENCOUNTER — Encounter (HOSPITAL_COMMUNITY): Payer: Self-pay | Admitting: Gastroenterology

## 2016-06-15 ENCOUNTER — Encounter: Payer: Self-pay | Admitting: Cardiology

## 2016-06-29 ENCOUNTER — Ambulatory Visit (INDEPENDENT_AMBULATORY_CARE_PROVIDER_SITE_OTHER): Payer: Medicare Other | Admitting: Cardiology

## 2016-06-29 ENCOUNTER — Encounter: Payer: Self-pay | Admitting: Cardiology

## 2016-06-29 VITALS — BP 128/64 | HR 76 | Ht 64.0 in | Wt 144.0 lb

## 2016-06-29 DIAGNOSIS — I2510 Atherosclerotic heart disease of native coronary artery without angina pectoris: Secondary | ICD-10-CM

## 2016-06-29 DIAGNOSIS — I10 Essential (primary) hypertension: Secondary | ICD-10-CM | POA: Diagnosis not present

## 2016-06-29 DIAGNOSIS — E785 Hyperlipidemia, unspecified: Secondary | ICD-10-CM

## 2016-06-29 NOTE — Progress Notes (Signed)
Cardiology Office Note    Date:  06/29/2016   ID:  Rodney Riley, DOB 01/12/1946, MRN [id number]  PCP:  Pearla DubonnetGATES,ROBERT NEVILL, MD  Cardiologist:  Armanda Magicraci Navi Ewton, MD   Chief Complaint  Patient presents with  . Coronary Artery Disease  . Hypertension  . Hyperlipidemia    History of Present Illness:  Rodney Riley is a 70 y.o. male with a history of ASCAD, HTN and dyslipidemia. He is doing well. He denies any chest pain, SOB, DOE, LE edema, dizziness, palpitations or syncope.  He walks 2.5 miles 4-5 times weekly.   Past Medical History:  Diagnosis Date  . Arthritis    fingers mild  . CAD (coronary artery disease)    s/p CABG 2003  . Erectile dysfunction   . HTN (hypertension) 07/18/2013  . Hyperlipidemia   . Hypertension   . Low back pain   . PONV (postoperative nausea and vomiting)    nausea and heache with ether 30 yrs ago  . Pre-diabetes     Past Surgical History:  Procedure Laterality Date  . CARDIAC SURGERY     stent x 1   . COLONOSCOPY WITH PROPOFOL N/A 06/08/2016   Procedure: COLONOSCOPY WITH PROPOFOL;  Surgeon: Charolett BumpersMartin K Johnson, MD;  Location: [facility] END[facility]COPY;  Service: Endoscopy;  Laterality: N/A;  . CORONARY ARTERY BYPASS GRAFT     x 3 or 4  . INGUINAL HERNIA REPAIR Left   . LUMBAR DISC SURGERY     x 2  . TONSILLECTOMY      Current Medications: Outpatient Medications Prior to Visit  Medication Sig Dispense Refill  . aspirin 81 MG tablet Take 4 tablets (325 mg total) by mouth daily.    Marland Kitchen. atorvastatin (LIPITOR) 40 MG tablet Take 1 tablet by mouth  daily 90 tablet 0  . cholecalciferol (VITAMIN D) 1000 units tablet Take 1,000 Units by mouth daily.    . metFORMIN (GLUCOPHAGE-XR) 500 MG 24 hr tablet Take 500 mg by mouth every evening.    . Multiple Vitamin (MULTIVITAMIN WITH MINERALS) TABS Take 1 tablet by mouth daily.    . Omega-3 Fatty Acids (FISH OIL) 1000 MG CAPS Take 3 capsules by mouth 3 (three) times daily after meals.    . quinapril (ACCUPRIL)  10 MG tablet Take 10 mg by mouth daily.    . cholecalciferol (VITAMIN D) 1000 UNITS tablet Take 1,000 Units by mouth daily.     No facility-administered medications prior to visit.      Allergies:   Review of patient's allergies indicates no known allergies.   Social History   Social History  . Marital status: Married    Spouse name: N/A  . Number of children: N/A  . Years of education: N/A   Social History Main Topics  . Smoking status: Never Smoker  . Smokeless tobacco: Never Used  . Alcohol use 0.6 oz/week    1 Glasses of wine per week     Comment: social   . Drug use: No  . Sexual activity: Not Asked   Other Topics Concern  . None   Social History Narrative   Pt is a retired Acupuncturistelectrical engineer            Family History:  The patient's family history includes Alzheimer's disease in his mother; COPD in his father; Cancer in his father; Diabetes in his mother; Heart disease in his brother, father, and mother.   ROS:   Please see the history of present illness.  ROS All other systems reviewed and are negative.  No flowsheet data found.     PHYSICAL EXAM:   VS:  BP 128/64   Pulse 76   Ht 5\' 4"  (1.626 m)   Wt 144 lb (65.3 kg)   BMI 24.72 kg/m    GEN: Well nourished, well developed, in no acute distress  HEENT: normal  Neck: no JVD, carotid bruits, or masses Cardiac: RRR; no murmurs, rubs, or gallops,no edema.  Intact distal pulses bilaterally.  Respiratory:  clear to auscultation bilaterally, normal work of breathing GI: soft, nontender, nondistended, + BS MS: no deformity or atrophy  Skin: warm and dry, no rash Neuro:  Alert and Oriented x 3, Strength and sensation are intact Psych: euthymic mood, full affect  Wt Readings from Last 3 Encounters:  06/29/16 144 lb (65.3 kg)  06/08/16 140 lb (63.5 kg)  03/10/15 139 lb 3.2 oz (63.1 kg)      Studies/Labs Reviewed:   EKG:  EKG is  ordered today.  The ekg ordered today demonstrates NSR with no ST  changes  Recent Labs: 08/27/2015: ALT 28   Lipid Panel    Component Value Date/Time   CHOL 111 (L) 08/27/2015 0856   TRIG 49 08/27/2015 0856   HDL 45 08/27/2015 0856   CHOLHDL 2.5 08/27/2015 0856   VLDL 10 08/27/2015 0856   LDLCALC 56 08/27/2015 0856    Additional studies/ records that were reviewed today include:  none    ASSESSMENT:    1. Coronary artery disease involving native coronary artery of native heart without angina pectoris   2. Essential hypertension   3. Dyslipidemia      PLAN:  In order of problems listed above:  1. ASCAD s/p CABG with no angina.  Continue ASA/statin 2. HTN - BP controlled on current meds.  Continue ACE I.   3. Dyslipidemia with LDL goal < 70.  Continue statin.  I will get a copy of his FLp and ALT from PCP when he gets labs done next week    Medication Adjustments/Labs and Tests Ordered: Current medicines are reviewed at length with the patient today.  Concerns regarding medicines are outlined above.  Medication changes, Labs and Tests ordered today are listed in the Patient Instructions below.  There are no Patient Instructions on file for this visit.   Signed, Armanda Magic, MD  06/29/2016 9:11 AM    [facility] [address] [place], [place], [place]  [address] Phone: [phone]; Fax: [phone]

## 2016-06-29 NOTE — Patient Instructions (Signed)

## 2017-07-19 ENCOUNTER — Encounter: Payer: Self-pay | Admitting: Cardiology

## 2017-07-19 ENCOUNTER — Telehealth: Payer: Self-pay

## 2017-07-19 ENCOUNTER — Ambulatory Visit (INDEPENDENT_AMBULATORY_CARE_PROVIDER_SITE_OTHER): Payer: Medicare Other | Admitting: Cardiology

## 2017-07-19 VITALS — BP 144/70 | HR 68 | Ht 64.0 in | Wt 146.2 lb

## 2017-07-19 DIAGNOSIS — I2510 Atherosclerotic heart disease of native coronary artery without angina pectoris: Secondary | ICD-10-CM | POA: Diagnosis not present

## 2017-07-19 DIAGNOSIS — E785 Hyperlipidemia, unspecified: Secondary | ICD-10-CM | POA: Diagnosis not present

## 2017-07-19 DIAGNOSIS — I10 Essential (primary) hypertension: Secondary | ICD-10-CM

## 2017-07-19 NOTE — Telephone Encounter (Signed)
Spoke with Elita QuickPam with [facility] and she will fax labs(BMET, FLP and ALT) per Dr. Mayford Knifeurner.

## 2017-07-19 NOTE — Patient Instructions (Signed)

## 2017-07-19 NOTE — Addendum Note (Signed)
Addended by: Madalyn RobOX, Alixis Harmon A on: 07/19/2017 04:17 PM   Modules accepted: Orders

## 2017-07-19 NOTE — Progress Notes (Signed)
Cardiology Office Note:    Date:  07/19/2017   ID:  Rodney Riley, DOB 09/23/1945, MRN [id number]  PCP:  Marden NobleGates, Robert, MD  Cardiologist:  Rodney Magicraci Turner, MD   Referring MD: Marden NobleGates, Robert, MD   Chief Complaint  Patient presents with  . Coronary Artery Disease  . Hypertension  . Hyperlipidemia    History of Present Illness:    Rodney PoagLawrence E Quintanar is a 71 y.o. male with a hx of ASCAD, HTN and dyslipidemia.  He is here today for followup and is doing well.  He denies any chest pain or pressure, SOB, DOE, PND, orthopnea, LE edema, dizziness, palpitations or syncope.  He is compliant with his meds and is tolerating meds with no SE.      Past Medical History:  Diagnosis Date  . Arthritis    fingers mild  . CAD (coronary artery disease)    s/p CABG 2003  . Erectile dysfunction   . HTN (hypertension) 07/18/2013  . Hyperlipidemia   . Hypertension   . Low back pain   . PONV (postoperative nausea and vomiting)    nausea and heache with ether 30 yrs ago  . Pre-diabetes     Past Surgical History:  Procedure Laterality Date  . CARDIAC SURGERY     stent x 1   . CORONARY ARTERY BYPASS GRAFT     x 3 or 4  . INGUINAL HERNIA REPAIR Left   . LUMBAR DISC SURGERY     x 2  . TONSILLECTOMY      Current Medications: Current Meds  Medication Sig  . aspirin EC 81 MG tablet Take 81 mg daily by mouth.  Marland Kitchen. atorvastatin (LIPITOR) 40 MG tablet Take 1 tablet by mouth  daily  . cholecalciferol (VITAMIN D) 1000 units tablet Take 1,000 Units by mouth daily.  . metFORMIN (GLUCOPHAGE-XR) 500 MG 24 hr tablet Take 500 mg 2 (two) times daily by mouth.   . Multiple Vitamin (MULTIVITAMIN WITH MINERALS) TABS Take 1 tablet by mouth daily.  . Omega-3 Fatty Acids (FISH OIL) 1000 MG CAPS Take 3 capsules by mouth 3 (three) times daily after meals.  . quinapril (ACCUPRIL) 10 MG tablet Take 10 mg by mouth daily.     Allergies:   Patient has no known allergies.   Social History   Socioeconomic History    . Marital status: Married    Spouse name: None  . Number of children: None  . Years of education: None  . Highest education level: None  Social Needs  . Financial resource strain: None  . Food insecurity - worry: None  . Food insecurity - inability: None  . Transportation needs - medical: None  . Transportation needs - non-medical: None  Occupational History  . None  Tobacco Use  . Smoking status: Never Smoker  . Smokeless tobacco: Never Used  Substance and Sexual Activity  . Alcohol use: Yes    Alcohol/week: 0.6 oz    Types: 1 Glasses of wine per week    Comment: social   . Drug use: No  . Sexual activity: None  Other Topics Concern  . None  Social History Narrative   Pt is a retired Acupuncturistelectrical engineer            Family History: The patient's family history includes Alzheimer's disease in his mother; COPD in his father; Cancer in his father; Diabetes in his mother; Heart disease in his brother, father, and mother.  ROS:   Please see  the history of present illness.    ROS  All other systems reviewed and negative.   EKGs/Labs/Other Studies Reviewed:    The following studies were reviewed today: none  EKG:  EKG is  ordered today.  The ekg ordered today demonstrates NSR with no ST changes  Recent Labs: No results found for requested labs within last 8760 hours.   Recent Lipid Panel    Component Value Date/Time   CHOL 111 (L) 08/27/2015 0856   TRIG 49 08/27/2015 0856   HDL 45 08/27/2015 0856   CHOLHDL 2.5 08/27/2015 0856   VLDL 10 08/27/2015 0856   LDLCALC 56 08/27/2015 0856    Physical Exam:    VS:  BP (!) 144/70   Pulse 68   Ht 5\' 4"  (1.626 m)   Wt 146 lb 3.2 oz (66.3 kg)   BMI 25.10 kg/m     Wt Readings from Last 3 Encounters:  07/19/17 146 lb 3.2 oz (66.3 kg)  06/29/16 144 lb (65.3 kg)  06/08/16 140 lb (63.5 kg)     GEN:  Well nourished, well developed in no acute distress HEENT: Normal NECK: No JVD; No carotid bruits LYMPHATICS: No  lymphadenopathy CARDIAC: RRR, no murmurs, rubs, gallops RESPIRATORY:  Clear to auscultation without rales, wheezing or rhonchi  ABDOMEN: Soft, non-tender, non-distended MUSCULOSKELETAL:  No edema; No deformity  SKIN: Warm and dry NEUROLOGIC:  Alert and oriented x 3 PSYCHIATRIC:  Normal affect   ASSESSMENT:    1. Coronary artery disease involving native coronary artery of native heart without angina pectoris   2. Essential hypertension   3. Dyslipidemia    PLAN:    In order of problems listed above:  1.  ASCHD status post remote CABG.  The patient denies any anginal symptoms.  He will continue on aspirin 81 mg daily and Lipitor 40 mg daily.  2.  Hypertension-blood pressure well controlled on exam today.  He will continue on Accupril 5 mg daily. I will get a copy of his BMET from PCP.    3.  Hyperlipidemia with LDL goal less than 70.  I will get a copy of his fasting lipid panel and ALT from his PCP.  He will continue on Lipitor 40 mg daily.   Medication Adjustments/Labs and Tests Ordered: Current medicines are reviewed at length with the patient today.  Concerns regarding medicines are outlined above.  No orders of the defined types were placed in this encounter.  No orders of the defined types were placed in this encounter.   Signed, Rodney Magicraci Turner, MD  07/19/2017 10:36 AM    [facility]

## 2018-07-18 NOTE — Progress Notes (Signed)
Cardiology Office Note:    Date:  07/19/2018   ID:  Rodney Riley, DOB [birth year], MRN [id number]  PCP:  Marden Noble, MD  Cardiologist:  No primary care provider on file.    Referring MD: Marden Noble, MD   Chief Complaint  Patient presents with  . Coronary Artery Disease  . Hypertension  . Hyperlipidemia    History of Present Illness:    Rodney Riley is a 72 y.o. male with a hx of  ASCAD, HTN and dyslipidemia.  He is here today for followup and is doing well.  He denies any chest pain or pressure, SOB, DOE, PND, orthopnea, LE edema, dizziness, palpitations or syncope. He is compliant with his meds and is tolerating meds with no SE.    Past Medical History:  Diagnosis Date  . Arthritis    fingers mild  . CAD (coronary artery disease)    s/p CABG 2003  . Erectile dysfunction   . HTN (hypertension) 07/18/2013  . Hyperlipidemia   . Hypertension   . Low back pain   . PONV (postoperative nausea and vomiting)    nausea and heache with ether 30 yrs ago  . Pre-diabetes     Past Surgical History:  Procedure Laterality Date  . CARDIAC SURGERY     stent x 1   . COLONOSCOPY WITH PROPOFOL N/A 06/08/2016   Procedure: COLONOSCOPY WITH PROPOFOL;  Surgeon: Charolett Bumpers, MD;  Location: [facility];  Service: Endoscopy;  Laterality: N/A;  . CORONARY ARTERY BYPASS GRAFT     x 3 or 4  . INGUINAL HERNIA REPAIR Left   . LUMBAR DISC SURGERY     x 2  . TONSILLECTOMY      Current Medications: Current Meds  Medication Sig  . aspirin EC 81 MG tablet Take 81 mg daily by mouth.  Marland Kitchen atorvastatin (LIPITOR) 40 MG tablet Take 1 tablet by mouth  daily  . cholecalciferol (VITAMIN D) 1000 units tablet Take 1,000 Units by mouth daily.  . metFORMIN (GLUCOPHAGE-XR) 500 MG 24 hr tablet Take 500 mg 2 (two) times daily by mouth.   . Multiple Vitamin (MULTIVITAMIN WITH MINERALS) TABS Take 1 tablet by mouth daily.  . Omega-3 Fatty Acids (FISH OIL) 1000 MG CAPS Take 3 capsules by mouth 3  (three) times daily after meals.  . quinapril (ACCUPRIL) 10 MG tablet Take 10 mg by mouth daily.     Allergies:   Patient has no known allergies.   Social History   Socioeconomic History  . Marital status: Married    Spouse name: Not on file  . Number of children: Not on file  . Years of education: Not on file  . Highest education level: Not on file  Occupational History  . Not on file  Social Needs  . Financial resource strain: Not on file  . Food insecurity:    Worry: Not on file    Inability: Not on file  . Transportation needs:    Medical: Not on file    Non-medical: Not on file  Tobacco Use  . Smoking status: Never Smoker  . Smokeless tobacco: Never Used  Substance and Sexual Activity  . Alcohol use: Yes    Alcohol/week: 1.0 standard drinks    Types: 1 Glasses of wine per week    Comment: social   . Drug use: No  . Sexual activity: Not on file  Lifestyle  . Physical activity:    Days per week: Not on file  Minutes per session: Not on file  . Stress: Not on file  Relationships  . Social connections:    Talks on phone: Not on file    Gets together: Not on file    Attends religious service: Not on file    Active member of club or organization: Not on file    Attends meetings of clubs or organizations: Not on file    Relationship status: Not on file  Other Topics Concern  . Not on file  Social History Narrative   Pt is a retired Acupuncturist            Family History: The patient's family history includes Alzheimer's disease in his mother; COPD in his father; Cancer in his father; Diabetes in his mother; Heart disease in his brother, father, and mother.  ROS:   Please see the history of present illness.    ROS  All other systems reviewed and negative.   EKGs/Labs/Other Studies Reviewed:    The following studies were reviewed today: none  EKG:  EKG is  ordered today.  The ekg ordered today demonstrates NSR at 83bpm with no ST  changes  Recent Labs: No results found for requested labs within last 8760 hours.   Recent Lipid Panel    Component Value Date/Time   CHOL 111 (L) 08/27/2015 0856   TRIG 49 08/27/2015 0856   HDL 45 08/27/2015 0856   CHOLHDL 2.5 08/27/2015 0856   VLDL 10 08/27/2015 0856   LDLCALC 56 08/27/2015 0856    Physical Exam:    VS:  BP 122/64   Pulse 83   Ht 5\' 4"  (1.626 m)   Wt 143 lb 1.9 oz (64.9 kg)   BMI 24.57 kg/m     Wt Readings from Last 3 Encounters:  07/19/18 143 lb 1.9 oz (64.9 kg)  07/19/17 146 lb 3.2 oz (66.3 kg)  06/29/16 144 lb (65.3 kg)     GEN:  Well nourished, well developed in no acute distress HEENT: Normal NECK: No JVD; No carotid bruits LYMPHATICS: No lymphadenopathy CARDIAC: RRR, no murmurs, rubs, gallops RESPIRATORY:  Clear to auscultation without rales, wheezing or rhonchi  ABDOMEN: Soft, non-tender, non-distended MUSCULOSKELETAL:  No edema; No deformity  SKIN: Warm and dry NEUROLOGIC:  Alert and oriented x 3 PSYCHIATRIC:  Normal affect   ASSESSMENT:    1. Coronary artery disease involving native coronary artery of native heart without angina pectoris   2. Essential hypertension   3. Dyslipidemia    PLAN:    In order of problems listed above:  1.  ASCAD - S/P remote CABG.  He denies any anginal sx.  He will continue on ASA 81mg  daily and statin.   2.  HTN - BP is controlled on exam . He will continue on Quinapril 10mg  daily.    3.  Hyperlipidemia - LDL goal is < 70.  His LDL was 64 on 07/11/2017. I will repeat FLP and ALT.  He will continue on atorvastatin 40mg  daily.     Medication Adjustments/Labs and Tests Ordered: Current medicines are reviewed at length with the patient today.  Concerns regarding medicines are outlined above.  Orders Placed This Encounter  Procedures  . EKG 12-Lead   No orders of the defined types were placed in this encounter.   Signed, Armanda Magic, MD  07/19/2018 10:33 AM    [facility]

## 2018-07-19 ENCOUNTER — Encounter: Payer: Self-pay | Admitting: Cardiology

## 2018-07-19 ENCOUNTER — Ambulatory Visit: Payer: Medicare Other | Admitting: Cardiology

## 2018-07-19 VITALS — BP 122/64 | HR 83 | Ht 64.0 in | Wt 143.1 lb

## 2018-07-19 DIAGNOSIS — E785 Hyperlipidemia, unspecified: Secondary | ICD-10-CM

## 2018-07-19 DIAGNOSIS — I2510 Atherosclerotic heart disease of native coronary artery without angina pectoris: Secondary | ICD-10-CM | POA: Diagnosis not present

## 2018-07-19 DIAGNOSIS — I10 Essential (primary) hypertension: Secondary | ICD-10-CM

## 2018-07-19 NOTE — Patient Instructions (Signed)

## 2019-08-14 ENCOUNTER — Telehealth: Payer: Self-pay | Admitting: *Deleted

## 2019-08-14 NOTE — Telephone Encounter (Signed)
Patient agreeable to change to virtual tomorrow.  He has an appointment later this month with PCP and can get labs drawn if any are needed at that time. Consent to MyChart. Pt would prefer VIDEO to his wife's number::[phone].

## 2019-08-15 ENCOUNTER — Telehealth (INDEPENDENT_AMBULATORY_CARE_PROVIDER_SITE_OTHER): Payer: Medicare Other | Admitting: Cardiology

## 2019-08-15 ENCOUNTER — Encounter: Payer: Self-pay | Admitting: Cardiology

## 2019-08-15 ENCOUNTER — Other Ambulatory Visit: Payer: Self-pay

## 2019-08-15 VITALS — Ht 64.0 in | Wt 145.0 lb

## 2019-08-15 DIAGNOSIS — E785 Hyperlipidemia, unspecified: Secondary | ICD-10-CM | POA: Diagnosis not present

## 2019-08-15 DIAGNOSIS — I2510 Atherosclerotic heart disease of native coronary artery without angina pectoris: Secondary | ICD-10-CM | POA: Diagnosis not present

## 2019-08-15 DIAGNOSIS — I10 Essential (primary) hypertension: Secondary | ICD-10-CM | POA: Diagnosis not present

## 2019-08-15 NOTE — Progress Notes (Signed)
Virtual Visit via Video Note   This visit type was conducted due to national recommendations for restrictions regarding the COVID-19 Pandemic (e.g. social distancing) in an effort to limit this patient's exposure and mitigate transmission in our community.  Due to his co-morbid illnesses, this patient is at least at moderate risk for complications without adequate follow up.  This format is felt to be most appropriate for this patient at this time.  All issues noted in this document were discussed and addressed.  A limited physical exam was performed with this format.  Please refer to the patient's chart for his consent to telehealth for [facility].  Evaluation Performed:  Follow-up visit  This visit type was conducted due to national recommendations for restrictions regarding the COVID-19 Pandemic (e.g. social distancing).  This format is felt to be most appropriate for this patient at this time.  All issues noted in this document were discussed and addressed.  No physical exam was performed (except for noted visual exam findings with Video Visits).  Please refer to the patient's chart (MyChart message for video visits and phone note for telephone visits) for the patient's consent to telehealth for [facility].  Date:  08/15/2019   ID:  Rodney Riley, DOB 1945-12-04, MRN [id number]  Patient Location:  Home  Provider location:   [place]  PCP:  Josetta Huddle, MD  Cardiologist:  Fransico Him, MD  Electrophysiologist:  None   Chief Complaint:  CAD, HTN, HLD  History of Present Illness:    Rodney Riley is a 73 y.o. male who presents via audio/video conferencing for a telehealth visit today.    Rodney Riley is a 73 y.o. male with a hx of ASCAD, HTN and dyslipidemia.  He is here today for followup and is doing well.  He denies any chest pain or pressure, , PND, orthopnea, LE edema, dizziness, palpitations or syncope.  He still occasionally notices some mild DOE when he  first starts to exercise (walking 2 miles) but this resolves as he gets into his walk.  This is a chronic problem that has been going on for several years and has not changes.  He is compliant with his meds and is tolerating meds with no SE.    The patient does not have symptoms concerning for COVID-19 infection (fever, chills, cough, or new shortness of breath).    Prior CV studies:   The following studies were reviewed today:  none  Past Medical History:  Diagnosis Date  . Arthritis    fingers mild  . CAD (coronary artery disease)    s/p CABG 2003  . Erectile dysfunction   . HTN (hypertension) 07/18/2013  . Hyperlipidemia   . Hypertension   . Low back pain   . PONV (postoperative nausea and vomiting)    nausea and heache with ether 30 yrs ago  . Pre-diabetes    Past Surgical History:  Procedure Laterality Date  . CARDIAC SURGERY     stent x 1   . COLONOSCOPY WITH PROPOFOL N/A 06/08/2016   Procedure: COLONOSCOPY WITH PROPOFOL;  Surgeon: Garlan Fair, MD;  Location: [facility];  Service: Endoscopy;  Laterality: N/A;  . CORONARY ARTERY BYPASS GRAFT     x 3 or 4  . INGUINAL HERNIA REPAIR Left   . LUMBAR DISC SURGERY     x 2  . TONSILLECTOMY       Current Meds  Medication Sig  . aspirin EC 81 MG tablet Take  81 mg daily by mouth.  Marland Kitchen atorvastatin (LIPITOR) 40 MG tablet Take 1 tablet by mouth  daily  . metFORMIN (GLUCOPHAGE-XR) 500 MG 24 hr tablet Take 500 mg 2 (two) times daily by mouth.   . Multiple Vitamin (MULTIVITAMIN WITH MINERALS) TABS Take 1 tablet by mouth daily.  . Omega-3 Fatty Acids (FISH OIL) 1000 MG CAPS Take 3 capsules by mouth 3 (three) times daily after meals.  . quinapril (ACCUPRIL) 5 MG tablet Take 5 mg by mouth daily.  . Vitamin D, Cholecalciferol, 50 MCG (2000 UT) CAPS Take 1 capsule by mouth daily.  . [DISCONTINUED] cholecalciferol (VITAMIN D) 1000 units tablet Take 1,000 Units by mouth daily.     Allergies:   Patient has no known allergies.    Social History   Tobacco Use  . Smoking status: Never Smoker  . Smokeless tobacco: Never Used  Substance Use Topics  . Alcohol use: Yes    Alcohol/week: 1.0 standard drinks    Types: 1 Glasses of wine per week    Comment: social   . Drug use: No     Family Hx: The patient's family history includes Alzheimer's disease in his mother; COPD in his father; Cancer in his father; Diabetes in his mother; Heart disease in his brother, father, and mother.  ROS:   Please see the history of present illness.     All other systems reviewed and are negative.   Labs/Other Tests and Data Reviewed:    Recent Labs: No results found for requested labs within last 8760 hours.   Recent Lipid Panel Lab Results  Component Value Date/Time   CHOL 111 (L) 08/27/2015 08:56 AM   TRIG 49 08/27/2015 08:56 AM   HDL 45 08/27/2015 08:56 AM   CHOLHDL 2.5 08/27/2015 08:56 AM   LDLCALC 56 08/27/2015 08:56 AM    Wt Readings from Last 3 Encounters:  08/15/19 145 lb (65.8 kg)  07/19/18 143 lb 1.9 oz (64.9 kg)  07/19/17 146 lb 3.2 oz (66.3 kg)     Objective:    Vital Signs:  Ht 5\' 4"  (1.626 m)   Wt 145 lb (65.8 kg)   BMI 24.89 kg/m     GEN: Well nourished, well developed in no acute distress HEENT: Normal NECK: No JVD; No carotid bruits LYMPHATICS: No lymphadenopathy CARDIAC:RRR, no murmurs, rubs, gallops RESPIRATORY:  Clear to auscultation without rales, wheezing or rhonchi  ABDOMEN: Soft, non-tender, non-distended MUSCULOSKELETAL:  No edema; No deformity  SKIN: Warm and dry NEUROLOGIC:  Alert and oriented x 3 PSYCHIATRIC:  Normal affect    ASSESSMENT & PLAN:    1.  ASCAD -s/p remote CABG -he denies any angina -continue ASA, statin  2.  HTN -BP controlled -continue Quinapril 5mg  daily -check BMET by PCP in 2 weeks and send to me  3.  HLD -LDL goal is < 70 -repeat FLP and ALT - this will be done by his PCP and sent to me -continue Atorvastatin 40mg  daily  4.  DOE - this is  mild and only occurs at the beginning of his walk and then resolves as he continues to walk and exercise -I think this is related to deconditoining.  He only walks 1 day a week and plays golf once weekly -I encouraged him to try to increase his days he exercises to build up his stamina -I encouraged him to call if anything changes and the SOB starts to linger while he is walking  COVID-19 Education: The signs and symptoms  of COVID-19 were discussed with the patient and how to seek care for testing (follow up with PCP or arrange E-visit).  The importance of social distancing was discussed today.  Patient Risk:   After full review of this patient's clinical status, I feel that they are at least moderate risk at this time.  Time:   Today, I have spent 20 minutes directly with the patient on telemedicine discussing medical problems including CAD, HTN, lipids.  We also reviewed the symptoms of COVID 19 and the ways to protect against contracting the virus with telehealth technology.  I spent an additional 5 minutes reviewing patient's chart including labs.  Medication Adjustments/Labs and Tests Ordered: Current medicines are reviewed at length with the patient today.  Concerns regarding medicines are outlined above.  Tests Ordered: No orders of the defined types were placed in this encounter.  Medication Changes: No orders of the defined types were placed in this encounter.   Disposition:  Follow up in 1 year(s)  Signed, Armanda Magicraci Sentoria Brent, MD  08/15/2019 10:47 AM    [facility]

## 2019-08-15 NOTE — Patient Instructions (Signed)
Medication Instructions:  Your physician recommends that you continue on your current medications as directed. Please refer to the Current Medication list given to you today.  *If you need a refill on your cardiac medications before your next appointment, please call your pharmacy*  Lab Work: None ordered.  Testing/Procedures: None ordered  Follow-Up: At [facility], you and your health needs are our priority.  As part of our continuing mission to provide you with exceptional heart care, we have created designated Provider Care Teams.  These Care Teams include your primary Cardiologist (physician) and Advanced Practice Providers (APPs -  Physician Assistants and Nurse Practitioners) who all work together to provide you with the care you need, when you need it.  Your next appointment:   1 year(s)  The format for your next appointment:   In Person  Provider:   Traci Turner, MD   

## 2019-10-05 ENCOUNTER — Ambulatory Visit: Payer: Medicare Other | Attending: Internal Medicine

## 2019-10-05 DIAGNOSIS — Z23 Encounter for immunization: Secondary | ICD-10-CM

## 2019-10-05 NOTE — Progress Notes (Signed)
   Covid-19 Vaccination Clinic  Name:  Rodney Riley    MRN: [id number] DOB: Mar 16, 1946  10/05/2019  [name] was observed post Covid-19 immunization for 15 minutes without incidence. He was provided with Vaccine Information Sheet and instruction to access the V-Safe system.   [name] was instructed to call 911 with any severe reactions post vaccine: Marland Kitchen Difficulty breathing  . Swelling of your face and throat  . A fast heartbeat  . A bad rash all over your body  . Dizziness and weakness    Immunizations Administered    Name Date Dose VIS Date Route   Pfizer COVID-19 Vaccine 10/05/2019  8:43 AM 0.3 mL 08/24/2019 Intramuscular   Manufacturer: [employer], [employer]   Lot: [id number]   NDC: [address]-5

## 2019-10-26 ENCOUNTER — Ambulatory Visit: Payer: Medicare Other | Attending: Internal Medicine

## 2019-10-26 DIAGNOSIS — Z23 Encounter for immunization: Secondary | ICD-10-CM

## 2019-10-26 NOTE — Progress Notes (Signed)
   Covid-19 Vaccination Clinic  Name:  [name]    MRN: [id number] DOB: 08-Feb-1946  10/26/2019  [name] was observed post Covid-19 immunization for 15 minutes without incidence. He was provided with Vaccine Information Sheet and instruction to access the V-Safe system.   [name] was instructed to call 911 with any severe reactions post vaccine: Marland Kitchen Difficulty breathing  . Swelling of your face and throat  . A fast heartbeat  . A bad rash all over your body  . Dizziness and weakness    Immunizations Administered    Name Date Dose VIS Date Route   Pfizer COVID-19 Vaccine 10/26/2019  9:32 AM 0.3 mL 08/24/2019 Intramuscular   Manufacturer: [employer], [employer]   Lot: [id number]   NDC: [address]-3

## 2019-11-02 ENCOUNTER — Ambulatory Visit: Payer: Medicare Other

## 2020-10-03 DIAGNOSIS — E559 Vitamin D deficiency, unspecified: Secondary | ICD-10-CM | POA: Diagnosis not present

## 2020-10-03 DIAGNOSIS — Z1389 Encounter for screening for other disorder: Secondary | ICD-10-CM | POA: Diagnosis not present

## 2020-10-03 DIAGNOSIS — Z0001 Encounter for general adult medical examination with abnormal findings: Secondary | ICD-10-CM | POA: Diagnosis not present

## 2020-10-03 DIAGNOSIS — Z7984 Long term (current) use of oral hypoglycemic drugs: Secondary | ICD-10-CM | POA: Diagnosis not present

## 2020-10-03 DIAGNOSIS — I10 Essential (primary) hypertension: Secondary | ICD-10-CM | POA: Diagnosis not present

## 2020-10-03 DIAGNOSIS — E119 Type 2 diabetes mellitus without complications: Secondary | ICD-10-CM | POA: Diagnosis not present

## 2020-10-03 DIAGNOSIS — E7800 Pure hypercholesterolemia, unspecified: Secondary | ICD-10-CM | POA: Diagnosis not present

## 2020-10-03 DIAGNOSIS — M189 Osteoarthritis of first carpometacarpal joint, unspecified: Secondary | ICD-10-CM | POA: Diagnosis not present

## 2020-10-03 DIAGNOSIS — I25119 Atherosclerotic heart disease of native coronary artery with unspecified angina pectoris: Secondary | ICD-10-CM | POA: Diagnosis not present

## 2020-10-03 DIAGNOSIS — Z79899 Other long term (current) drug therapy: Secondary | ICD-10-CM | POA: Diagnosis not present

## 2020-10-04 DIAGNOSIS — M189 Osteoarthritis of first carpometacarpal joint, unspecified: Secondary | ICD-10-CM | POA: Diagnosis not present

## 2020-10-04 DIAGNOSIS — E7800 Pure hypercholesterolemia, unspecified: Secondary | ICD-10-CM | POA: Diagnosis not present

## 2020-10-04 DIAGNOSIS — E119 Type 2 diabetes mellitus without complications: Secondary | ICD-10-CM | POA: Diagnosis not present

## 2020-10-04 DIAGNOSIS — I25119 Atherosclerotic heart disease of native coronary artery with unspecified angina pectoris: Secondary | ICD-10-CM | POA: Diagnosis not present

## 2020-10-04 DIAGNOSIS — I10 Essential (primary) hypertension: Secondary | ICD-10-CM | POA: Diagnosis not present

## 2020-11-06 DIAGNOSIS — E7800 Pure hypercholesterolemia, unspecified: Secondary | ICD-10-CM | POA: Diagnosis not present

## 2020-11-06 DIAGNOSIS — M189 Osteoarthritis of first carpometacarpal joint, unspecified: Secondary | ICD-10-CM | POA: Diagnosis not present

## 2020-11-06 DIAGNOSIS — E119 Type 2 diabetes mellitus without complications: Secondary | ICD-10-CM | POA: Diagnosis not present

## 2020-11-06 DIAGNOSIS — I25119 Atherosclerotic heart disease of native coronary artery with unspecified angina pectoris: Secondary | ICD-10-CM | POA: Diagnosis not present

## 2020-11-06 DIAGNOSIS — I10 Essential (primary) hypertension: Secondary | ICD-10-CM | POA: Diagnosis not present

## 2021-01-21 DIAGNOSIS — M720 Palmar fascial fibromatosis [Dupuytren]: Secondary | ICD-10-CM | POA: Diagnosis not present

## 2021-02-04 ENCOUNTER — Ambulatory Visit: Payer: Medicare Other | Admitting: Cardiology

## 2021-02-04 ENCOUNTER — Encounter: Payer: Self-pay | Admitting: Cardiology

## 2021-02-04 ENCOUNTER — Other Ambulatory Visit: Payer: Self-pay

## 2021-02-04 VITALS — BP 130/62 | HR 74 | Ht 64.0 in | Wt 136.4 lb

## 2021-02-04 DIAGNOSIS — E785 Hyperlipidemia, unspecified: Secondary | ICD-10-CM

## 2021-02-04 DIAGNOSIS — Z01810 Encounter for preprocedural cardiovascular examination: Secondary | ICD-10-CM

## 2021-02-04 DIAGNOSIS — I10 Essential (primary) hypertension: Secondary | ICD-10-CM | POA: Diagnosis not present

## 2021-02-04 DIAGNOSIS — I2510 Atherosclerotic heart disease of native coronary artery without angina pectoris: Secondary | ICD-10-CM | POA: Diagnosis not present

## 2021-02-04 DIAGNOSIS — Z01818 Encounter for other preprocedural examination: Secondary | ICD-10-CM

## 2021-02-04 MED ORDER — ATORVASTATIN CALCIUM 40 MG PO TABS: 1.0000 | ORAL_TABLET | Freq: Every day | ORAL | 0 refills | Status: AC

## 2021-02-04 NOTE — Patient Instructions (Addendum)

## 2021-02-04 NOTE — Progress Notes (Signed)
Date:  02/04/2021   ID:  Rodney Riley, DOB 05-02-1946, MRN [id number]  PCP:  Marden Noble, MD  Cardiologist:  Armanda Magic, MD  Electrophysiologist:  None   Chief Complaint:  CAD, HTN, HLD  History of Present Illness:    Rodney Riley is a 75 y.o. male with a hx of ASCAD, HTN and dyslipidemia.  He is here today for followup and is doing well.  He denies any chest pain or pressure, SOB, DOE, PND, orthopnea, LE edema, dizziness, palpitations or syncope. He is compliant with his meds and is tolerating meds with no SE.    Prior CV studies:   The following studies were reviewed today:  none  Past Medical History:  Diagnosis Date  . Arthritis    fingers mild  . CAD (coronary artery disease)    s/p CABG 2003  . Erectile dysfunction   . HTN (hypertension) 07/18/2013  . Hyperlipidemia   . Hypertension   . Low back pain   . PONV (postoperative nausea and vomiting)    nausea and heache with ether 30 yrs ago  . Pre-diabetes    Past Surgical History:  Procedure Laterality Date  . CARDIAC SURGERY     stent x 1   . COLONOSCOPY WITH PROPOFOL N/A 06/08/2016   Procedure: COLONOSCOPY WITH PROPOFOL;  Surgeon: Charolett Bumpers, MD;  Location: [facility];  Service: Endoscopy;  Laterality: N/A;  . CORONARY ARTERY BYPASS GRAFT     x 3 or 4  . INGUINAL HERNIA REPAIR Left   . LUMBAR DISC SURGERY     x 2  . TONSILLECTOMY       Current Meds  Medication Sig  . aspirin EC 81 MG tablet Take 81 mg daily by mouth.  . metFORMIN (GLUCOPHAGE-XR) 500 MG 24 hr tablet Take 500 mg 2 (two) times daily by mouth.   . Multiple Vitamin (MULTIVITAMIN WITH MINERALS) TABS Take 1 tablet by mouth daily.  . Omega-3 Fatty Acids (FISH OIL) 1000 MG CAPS Take 3 capsules by mouth 3 (three) times daily after meals.  . quinapril (ACCUPRIL) 5 MG tablet Take 5 mg by mouth daily.  . Vitamin D, Cholecalciferol, 50 MCG (2000 UT) CAPS Take 1 capsule by mouth daily.  . [DISCONTINUED] atorvastatin (LIPITOR) 40 MG  tablet Take 1 tablet by mouth  daily     Allergies:   Procaine   Social History   Tobacco Use  . Smoking status: Never Smoker  . Smokeless tobacco: Never Used  Vaping Use  . Vaping Use: Never used  Substance Use Topics  . Alcohol use: Yes    Alcohol/week: 1.0 standard drink    Types: 1 Glasses of wine per week    Comment: social   . Drug use: No     Family Hx: The patient's family history includes Alzheimer's disease in his mother; COPD in his father; Cancer in his father; Diabetes in his mother; Heart disease in his brother, father, and mother.  ROS:   Please see the history of present illness.     All other systems reviewed and are negative.   Labs/Other Tests and Data Reviewed:    Recent Labs: No results found for requested labs within last 8760 hours.   Recent Lipid Panel Lab Results  Component Value Date/Time   CHOL 111 (L) 08/27/2015 08:56 AM   TRIG 49 08/27/2015 08:56 AM   HDL 45 08/27/2015 08:56 AM   CHOLHDL 2.5 08/27/2015 08:56 AM   LDLCALC 56  08/27/2015 08:56 AM    Wt Readings from Last 3 Encounters:  02/04/21 136 lb 6.4 oz (61.9 kg)  08/15/19 145 lb (65.8 kg)  07/19/18 143 lb 1.9 oz (64.9 kg)     Objective:    Vital Signs:  BP 130/62   Pulse 74   Ht 5\' 4"  (1.626 m)   Wt 136 lb 6.4 oz (61.9 kg)   BMI 23.41 kg/m    GEN: Well nourished, well developed in no acute distress HEENT: Normal NECK: No JVD; No carotid bruits LYMPHATICS: No lymphadenopathy CARDIAC:RRR, no murmurs, rubs, gallops RESPIRATORY:  Clear to auscultation without rales, wheezing or rhonchi  ABDOMEN: Soft, non-tender, non-distended MUSCULOSKELETAL:  No edema; No deformity  SKIN: Warm and dry NEUROLOGIC:  Alert and oriented x 3 PSYCHIATRIC:  Normal affect   EKG was performed in the office today and demonstrates NSR  ASSESSMENT & PLAN:    1.  ASCAD -s/p remote CABG -he has not had any anginal sx since I saw him last -continue ASA, statin  2.  HTN -BP is well  controlled on exam today -Continue prescription drug management with Quinapril 5mg  daily>>refilled for 1 year -I have personally reviewed and interpreted outside labs performed by patient's PCP which showed SCr 0.74 and K+ 4.7 in Jan 2022  3.  HLD -LDL goal is < 70 -I have personally reviewed and interpreted outside labs performed by patient's PCP which showed LDL 53, HDL 48 and TAGs 73 in Jan 2022 -Continue prescription drug management with Atorvastatin 40mg  daily>>refilled for 1 year  4.  Preoperative cardiac clearance -he is able to complete 8 mets with no anginal symptoms and therefore does not require further ischemic workup based on the Duke Activity status index -he is low risk for surgery based on the Revised Cardiac Risk index with a 0.9% perioperative risk of major cardiac event   Medication Adjustments/Labs and Tests Ordered: Current medicines are reviewed at length with the patient today.  Concerns regarding medicines are outlined above.  Tests Ordered: Orders Placed This Encounter  Procedures  . EKG 12-Lead   Medication Changes: Meds ordered this encounter  Medications  . atorvastatin (LIPITOR) 40 MG tablet    Sig: Take 1 tablet (40 mg total) by mouth daily.    Dispense:  6 tablet    Refill:  0    Disposition:  Follow up in 1 year(s)  Signed, Feb 2022, MD  02/04/2021 9:19 AM    [facility]

## 2021-03-10 DIAGNOSIS — G8918 Other acute postprocedural pain: Secondary | ICD-10-CM | POA: Diagnosis not present

## 2021-03-10 DIAGNOSIS — M720 Palmar fascial fibromatosis [Dupuytren]: Secondary | ICD-10-CM | POA: Diagnosis not present

## 2021-03-17 DIAGNOSIS — M720 Palmar fascial fibromatosis [Dupuytren]: Secondary | ICD-10-CM | POA: Diagnosis not present

## 2021-03-18 DIAGNOSIS — I10 Essential (primary) hypertension: Secondary | ICD-10-CM | POA: Diagnosis not present

## 2021-03-18 DIAGNOSIS — H524 Presbyopia: Secondary | ICD-10-CM | POA: Diagnosis not present

## 2021-03-18 DIAGNOSIS — M25641 Stiffness of right hand, not elsewhere classified: Secondary | ICD-10-CM | POA: Diagnosis not present

## 2021-03-18 DIAGNOSIS — H35033 Hypertensive retinopathy, bilateral: Secondary | ICD-10-CM | POA: Diagnosis not present

## 2021-03-23 DIAGNOSIS — M79642 Pain in left hand: Secondary | ICD-10-CM | POA: Diagnosis not present

## 2021-03-31 DIAGNOSIS — M25641 Stiffness of right hand, not elsewhere classified: Secondary | ICD-10-CM | POA: Diagnosis not present

## 2021-04-07 DIAGNOSIS — M25641 Stiffness of right hand, not elsewhere classified: Secondary | ICD-10-CM | POA: Diagnosis not present

## 2021-04-16 DIAGNOSIS — M25641 Stiffness of right hand, not elsewhere classified: Secondary | ICD-10-CM | POA: Diagnosis not present

## 2021-05-05 DIAGNOSIS — M25641 Stiffness of right hand, not elsewhere classified: Secondary | ICD-10-CM | POA: Diagnosis not present

## 2021-05-12 DIAGNOSIS — I10 Essential (primary) hypertension: Secondary | ICD-10-CM | POA: Diagnosis not present

## 2021-05-12 DIAGNOSIS — E7800 Pure hypercholesterolemia, unspecified: Secondary | ICD-10-CM | POA: Diagnosis not present

## 2021-05-12 DIAGNOSIS — Z7984 Long term (current) use of oral hypoglycemic drugs: Secondary | ICD-10-CM | POA: Diagnosis not present

## 2021-05-12 DIAGNOSIS — E119 Type 2 diabetes mellitus without complications: Secondary | ICD-10-CM | POA: Diagnosis not present

## 2021-08-13 DIAGNOSIS — R059 Cough, unspecified: Secondary | ICD-10-CM | POA: Diagnosis not present

## 2021-10-14 DIAGNOSIS — Z7984 Long term (current) use of oral hypoglycemic drugs: Secondary | ICD-10-CM | POA: Diagnosis not present

## 2021-10-14 DIAGNOSIS — I10 Essential (primary) hypertension: Secondary | ICD-10-CM | POA: Diagnosis not present

## 2021-10-14 DIAGNOSIS — E119 Type 2 diabetes mellitus without complications: Secondary | ICD-10-CM | POA: Diagnosis not present

## 2021-10-14 DIAGNOSIS — Z0001 Encounter for general adult medical examination with abnormal findings: Secondary | ICD-10-CM | POA: Diagnosis not present

## 2021-10-14 DIAGNOSIS — E559 Vitamin D deficiency, unspecified: Secondary | ICD-10-CM | POA: Diagnosis not present

## 2021-10-14 DIAGNOSIS — I25119 Atherosclerotic heart disease of native coronary artery with unspecified angina pectoris: Secondary | ICD-10-CM | POA: Diagnosis not present

## 2021-10-14 DIAGNOSIS — E7800 Pure hypercholesterolemia, unspecified: Secondary | ICD-10-CM | POA: Diagnosis not present

## 2021-11-11 DIAGNOSIS — I10 Essential (primary) hypertension: Secondary | ICD-10-CM | POA: Diagnosis not present

## 2022-04-15 DIAGNOSIS — I10 Essential (primary) hypertension: Secondary | ICD-10-CM | POA: Diagnosis not present

## 2022-04-15 DIAGNOSIS — Z79899 Other long term (current) drug therapy: Secondary | ICD-10-CM | POA: Diagnosis not present

## 2022-04-15 DIAGNOSIS — L219 Seborrheic dermatitis, unspecified: Secondary | ICD-10-CM | POA: Diagnosis not present

## 2022-04-15 DIAGNOSIS — E119 Type 2 diabetes mellitus without complications: Secondary | ICD-10-CM | POA: Diagnosis not present

## 2022-04-15 DIAGNOSIS — B354 Tinea corporis: Secondary | ICD-10-CM | POA: Diagnosis not present

## 2022-08-18 DIAGNOSIS — R52 Pain, unspecified: Secondary | ICD-10-CM | POA: Diagnosis not present

## 2022-08-18 DIAGNOSIS — M79641 Pain in right hand: Secondary | ICD-10-CM | POA: Diagnosis not present

## 2022-08-18 DIAGNOSIS — M65341 Trigger finger, right ring finger: Secondary | ICD-10-CM | POA: Diagnosis not present

## 2022-08-18 DIAGNOSIS — M720 Palmar fascial fibromatosis [Dupuytren]: Secondary | ICD-10-CM | POA: Diagnosis not present

## 2022-08-18 DIAGNOSIS — M79642 Pain in left hand: Secondary | ICD-10-CM | POA: Diagnosis not present

## 2022-08-18 DIAGNOSIS — M13849 Other specified arthritis, unspecified hand: Secondary | ICD-10-CM | POA: Diagnosis not present

## 2022-08-18 DIAGNOSIS — M65322 Trigger finger, left index finger: Secondary | ICD-10-CM | POA: Diagnosis not present

## 2022-09-25 DIAGNOSIS — Z20822 Contact with and (suspected) exposure to covid-19: Secondary | ICD-10-CM | POA: Diagnosis not present

## 2022-09-29 DIAGNOSIS — M79642 Pain in left hand: Secondary | ICD-10-CM | POA: Diagnosis not present

## 2022-09-29 DIAGNOSIS — M65341 Trigger finger, right ring finger: Secondary | ICD-10-CM | POA: Diagnosis not present

## 2022-09-29 DIAGNOSIS — M720 Palmar fascial fibromatosis [Dupuytren]: Secondary | ICD-10-CM | POA: Diagnosis not present

## 2022-09-29 DIAGNOSIS — M79641 Pain in right hand: Secondary | ICD-10-CM | POA: Diagnosis not present

## 2022-10-10 DIAGNOSIS — U071 COVID-19: Secondary | ICD-10-CM | POA: Diagnosis not present

## 2022-11-23 ENCOUNTER — Ambulatory Visit
Admission: RE | Admit: 2022-11-23 | Discharge: 2022-11-23 | Disposition: A | Discharge status: Home / Self Care | Payer: Medicare Other | Source: Ambulatory Visit | Attending: Internal Medicine | Admitting: Internal Medicine

## 2022-11-23 ENCOUNTER — Other Ambulatory Visit: Payer: Self-pay | Admitting: Internal Medicine

## 2022-11-23 DIAGNOSIS — R059 Cough, unspecified: Secondary | ICD-10-CM | POA: Diagnosis not present

## 2022-11-23 DIAGNOSIS — R0989 Other specified symptoms and signs involving the circulatory and respiratory systems: Secondary | ICD-10-CM | POA: Diagnosis not present

## 2022-11-23 DIAGNOSIS — E1165 Type 2 diabetes mellitus with hyperglycemia: Secondary | ICD-10-CM | POA: Diagnosis not present

## 2022-11-23 DIAGNOSIS — E559 Vitamin D deficiency, unspecified: Secondary | ICD-10-CM | POA: Diagnosis not present

## 2022-11-23 DIAGNOSIS — I10 Essential (primary) hypertension: Secondary | ICD-10-CM | POA: Diagnosis not present

## 2022-11-23 DIAGNOSIS — E7800 Pure hypercholesterolemia, unspecified: Secondary | ICD-10-CM | POA: Diagnosis not present

## 2022-11-23 DIAGNOSIS — Z0000 Encounter for general adult medical examination without abnormal findings: Secondary | ICD-10-CM | POA: Diagnosis not present

## 2022-11-23 DIAGNOSIS — R053 Chronic cough: Secondary | ICD-10-CM | POA: Diagnosis not present

## 2022-11-23 DIAGNOSIS — Z79899 Other long term (current) drug therapy: Secondary | ICD-10-CM | POA: Diagnosis not present

## 2022-11-23 DIAGNOSIS — E119 Type 2 diabetes mellitus without complications: Secondary | ICD-10-CM | POA: Diagnosis not present

## 2022-11-23 DIAGNOSIS — I25119 Atherosclerotic heart disease of native coronary artery with unspecified angina pectoris: Secondary | ICD-10-CM | POA: Diagnosis not present

## 2023-03-12 NOTE — Progress Notes (Unsigned)
Cardiology Office Note:  .   Date:  03/14/2023  ID:  Rodney Riley, DOB July 12, 1946, MRN [id number] PCP: Marden Noble, MD (Inactive)  [facility] Providers Cardiologist:  Armanda Magic, MD    Patient Profile: .      PMH: CAD S/p CABG 2003 Hypertension Dyslipidemia   Last cardiology clinic visit was 02/04/2021 with Dr. Mayford Knife. He had no cardiac sympotms and was cleared to undergo hand surgery.        History of Present Illness: .   Rodney Riley is a very pleasant 77 y.o. male who is here today for overdue follow-up. Took quinapril for years, was changed to losartan and metoprolol prior to PCP retirement. BP fluctuating a little. Home BP 128 to low 130s systolic over 60s to 70s diastolic. Has occasional dizziness, feels that it is secondary to significant fall from roof in 2013. Has had dizziness since that time, improved over recent year. Remains active with house and yard work and regularly plays golf (rides in cart). Also walks 1-2 miles for exercise. Gets a little SOB for first 500 feet then breathing settles down and he has no shortness of breath by the end of the walk. Admits to some deconditioning. Loves to travel. He denies chest pain, lower extremity edema, fatigue, palpitations, melena, presyncope, syncope, orthopnea, and PND.   ROS: See HPI       Studies Reviewed: Marland Kitchen   EKG Interpretation Date/Time:  Monday March 14 2023 13:28:03 EDT Ventricular Rate:  74 PR Interval:  148 QRS Duration:  154 QT Interval:  422 QTC Calculation: 468 R Axis:   -28  Text Interpretation: Normal sinus rhythm Right bundle branch block Septal infarct , age undetermined When compared with ECG of 12-Oct-2001 20:22, Right bundle branch block is now Present Questionable change in Septal leads Confirmed by Eligha Bridegroom [phone]) on 03/14/2023 4:29:04 PM     Risk Assessment/Calculations:     HYPERTENSION CONTROL Vitals:   03/14/23 1324 03/14/23 1629  BP: (!) 140/60 (!) 140/70    The  patient's blood pressure is elevated above target today.  In order to address the patient's elevated BP: Blood pressure will be monitored at home to determine if medication changes need to be made.          Physical Exam:   VS:  BP (!) 140/70   Pulse 73   Ht 5\' 4"  (1.626 m)   Wt 137 lb 6.4 oz (62.3 kg)   SpO2 96%   BMI 23.58 kg/m    Wt Readings from Last 3 Encounters:  03/14/23 137 lb 6.4 oz (62.3 kg)  02/04/21 136 lb 6.4 oz (61.9 kg)  08/15/19 145 lb (65.8 kg)    GEN: Well nourished, well developed in no acute distress NECK: No JVD; No carotid bruits CARDIAC: RRR, no murmurs, rubs, gallops RESPIRATORY:  Clear to auscultation without rales, wheezing or rhonchi  ABDOMEN: Soft, non-tender, non-distended EXTREMITIES:  No edema; No deformity     ASSESSMENT AND PLAN: .    Right bundle branch block: Newly identified on EKG today. HR is well controlled. We reviewed potential causes. He is completely asymptomatic. No history of shortness of breath, sleep apnea, or lung disease. No prior echo in EMR. We will get echo to evaluate heart and valve function.   CAD without angina: S/p CABG 2003. He reports no further episodes of angina. Questionable septal q waves on EKG, also with newly identified RBBB. Recalls a small vessel that was not amendable  to PCI or surgery. Consider worsening ischemia in the setting of new RBBB. He is completely asymptomatic. Will get echo for evaluation of wall motion abnormality. Consider further ischemic evaluation if he becomes symptomatic. 150 minutes moderate intensity exercise each week and mostly plant based diet encouraged.  No bleeding concerns. Continue metoprolol, losartan, aspirin, atorvastatin.  Hypertension: Mildly elevated BP during clinic visit. Home BP is well controlled. No medication changes today.   Hyperlipidemia LDL goal < 55: LDL 55 on 11/23/2022. Well-controlled. Continue atorvastatin. Encouraged mostly plant based diet low in saturated fat.         Dispo: 1 year with Dr. Mayford Knife  Signed, Eligha Bridegroom, NP-C

## 2023-03-14 ENCOUNTER — Ambulatory Visit: Payer: Medicare Other | Attending: Nurse Practitioner | Admitting: Nurse Practitioner

## 2023-03-14 ENCOUNTER — Encounter: Payer: Self-pay | Admitting: Nurse Practitioner

## 2023-03-14 VITALS — BP 140/70 | HR 73 | Ht 64.0 in | Wt 137.4 lb

## 2023-03-14 DIAGNOSIS — I10 Essential (primary) hypertension: Secondary | ICD-10-CM | POA: Diagnosis not present

## 2023-03-14 DIAGNOSIS — I4510 Unspecified right bundle-branch block: Secondary | ICD-10-CM | POA: Diagnosis not present

## 2023-03-14 DIAGNOSIS — I2510 Atherosclerotic heart disease of native coronary artery without angina pectoris: Secondary | ICD-10-CM

## 2023-03-14 DIAGNOSIS — E785 Hyperlipidemia, unspecified: Secondary | ICD-10-CM

## 2023-03-14 NOTE — Patient Instructions (Signed)
Medication Instructions:   Your physician recommends that you continue on your current medications as directed. Please refer to the Current Medication list given to you today.  *If you need a refill on your cardiac medications before your next appointment, please call your pharmacy*   Lab Work:  None ordered.  If you have labs (blood work) drawn today and your tests are completely normal, you will receive your results only by: MyChart Message (if you have MyChart) OR A paper copy in the mail If you have any lab test that is abnormal or we need to change your treatment, we will call you to review the results.   Testing/Procedures:  Your physician has requested that you have an echocardiogram. Echocardiography is a painless test that uses sound waves to create images of your heart. It provides your doctor with information about the size and shape of your heart and how well your heart's chambers and valves are working. This procedure takes approximately one hour. There are no restrictions for this procedure. Please do NOT wear cologne, aftershave, or lotions (deodorant is allowed). Please arrive 15 minutes prior to your appointment time.    Follow-Up: At [facility], you and your health needs are our priority.  As part of our continuing mission to provide you with exceptional heart care, we have created designated Provider Care Teams.  These Care Teams include your primary Cardiologist (physician) and Advanced Practice Providers (APPs -  Physician Assistants and Nurse Practitioners) who all work together to provide you with the care you need, when you need it.  We recommend signing up for the patient portal called "MyChart".  Sign up information is provided on this After Visit Summary.  MyChart is used to connect with patients for Virtual Visits (Telemedicine).  Patients are able to view lab/test results, encounter notes, upcoming appointments, etc.  Non-urgent messages can be sent  to your provider as well.   To learn more about what you can do with MyChart, go to [email or web address].    Your next appointment:   1 year(s)  Provider:   Armanda Magic, MD     Other Instructions  Your physician wants you to follow-up in: 1 year with Dr. Mayford Knife.  You will receive a reminder letter in the mail two months in advance. If you don't receive a letter, please call our office to schedule the follow-up appointment.

## 2023-04-20 ENCOUNTER — Ambulatory Visit (HOSPITAL_COMMUNITY): Payer: Medicare Other | Attending: Cardiovascular Disease

## 2023-04-20 DIAGNOSIS — I4510 Unspecified right bundle-branch block: Secondary | ICD-10-CM | POA: Diagnosis not present

## 2023-04-20 DIAGNOSIS — I2510 Atherosclerotic heart disease of native coronary artery without angina pectoris: Secondary | ICD-10-CM | POA: Diagnosis not present

## 2023-04-20 DIAGNOSIS — I10 Essential (primary) hypertension: Secondary | ICD-10-CM | POA: Diagnosis not present

## 2023-04-20 LAB — ECHOCARDIOGRAM COMPLETE
Area-P 1/2: 4.68 cm2
Calc EF: 57 %
P 1/2 time: 685 msec
S' Lateral: 2.7 cm
Single Plane A2C EF: 61.7 %
Single Plane A4C EF: 54 %

## 2023-05-26 DIAGNOSIS — E7800 Pure hypercholesterolemia, unspecified: Secondary | ICD-10-CM | POA: Diagnosis not present

## 2023-05-26 DIAGNOSIS — Z1283 Encounter for screening for malignant neoplasm of skin: Secondary | ICD-10-CM | POA: Diagnosis not present

## 2023-05-26 DIAGNOSIS — I25119 Atherosclerotic heart disease of native coronary artery with unspecified angina pectoris: Secondary | ICD-10-CM | POA: Diagnosis not present

## 2023-05-26 DIAGNOSIS — I10 Essential (primary) hypertension: Secondary | ICD-10-CM | POA: Diagnosis not present

## 2023-05-26 DIAGNOSIS — E559 Vitamin D deficiency, unspecified: Secondary | ICD-10-CM | POA: Diagnosis not present

## 2023-05-26 DIAGNOSIS — E1165 Type 2 diabetes mellitus with hyperglycemia: Secondary | ICD-10-CM | POA: Diagnosis not present

## 2023-05-26 DIAGNOSIS — Z9181 History of falling: Secondary | ICD-10-CM | POA: Diagnosis not present

## 2023-06-14 DIAGNOSIS — E119 Type 2 diabetes mellitus without complications: Secondary | ICD-10-CM | POA: Diagnosis not present

## 2023-06-14 DIAGNOSIS — I10 Essential (primary) hypertension: Secondary | ICD-10-CM | POA: Diagnosis not present

## 2023-11-29 DIAGNOSIS — Z0000 Encounter for general adult medical examination without abnormal findings: Secondary | ICD-10-CM | POA: Diagnosis not present

## 2023-11-29 DIAGNOSIS — I10 Essential (primary) hypertension: Secondary | ICD-10-CM | POA: Diagnosis not present

## 2023-11-29 DIAGNOSIS — E119 Type 2 diabetes mellitus without complications: Secondary | ICD-10-CM | POA: Diagnosis not present

## 2023-11-29 DIAGNOSIS — Z79899 Other long term (current) drug therapy: Secondary | ICD-10-CM | POA: Diagnosis not present

## 2023-11-29 DIAGNOSIS — E559 Vitamin D deficiency, unspecified: Secondary | ICD-10-CM | POA: Diagnosis not present

## 2023-11-29 DIAGNOSIS — Z1389 Encounter for screening for other disorder: Secondary | ICD-10-CM | POA: Diagnosis not present

## 2023-11-29 DIAGNOSIS — E7800 Pure hypercholesterolemia, unspecified: Secondary | ICD-10-CM | POA: Diagnosis not present

## 2023-11-29 DIAGNOSIS — E1165 Type 2 diabetes mellitus with hyperglycemia: Secondary | ICD-10-CM | POA: Diagnosis not present

## 2023-11-29 DIAGNOSIS — I25119 Atherosclerotic heart disease of native coronary artery with unspecified angina pectoris: Secondary | ICD-10-CM | POA: Diagnosis not present

## 2024-02-22 DIAGNOSIS — E119 Type 2 diabetes mellitus without complications: Secondary | ICD-10-CM | POA: Diagnosis not present

## 2024-02-22 DIAGNOSIS — H43813 Vitreous degeneration, bilateral: Secondary | ICD-10-CM | POA: Diagnosis not present

## 2024-02-22 DIAGNOSIS — H43393 Other vitreous opacities, bilateral: Secondary | ICD-10-CM | POA: Diagnosis not present

## 2024-03-09 DIAGNOSIS — R634 Abnormal weight loss: Secondary | ICD-10-CM | POA: Diagnosis not present

## 2024-03-09 DIAGNOSIS — E1165 Type 2 diabetes mellitus with hyperglycemia: Secondary | ICD-10-CM | POA: Diagnosis not present

## 2024-03-09 DIAGNOSIS — I10 Essential (primary) hypertension: Secondary | ICD-10-CM | POA: Diagnosis not present

## 2024-03-09 DIAGNOSIS — E559 Vitamin D deficiency, unspecified: Secondary | ICD-10-CM | POA: Diagnosis not present

## 2024-03-09 DIAGNOSIS — E119 Type 2 diabetes mellitus without complications: Secondary | ICD-10-CM | POA: Diagnosis not present

## 2024-03-09 DIAGNOSIS — R21 Rash and other nonspecific skin eruption: Secondary | ICD-10-CM | POA: Diagnosis not present

## 2024-03-09 DIAGNOSIS — I25119 Atherosclerotic heart disease of native coronary artery with unspecified angina pectoris: Secondary | ICD-10-CM | POA: Diagnosis not present

## 2024-03-20 NOTE — Progress Notes (Unsigned)
 Date:  03/21/2024   ID:  Rodney Riley, DOB Oct 10, 1945, MRN [id number]  PCP:  Charlott Dorn LABOR, MD  Cardiologist:  Wilbert Bihari, MD  Electrophysiologist:  None   Chief Complaint:  CAD, HTN, HLD  History of Present Illness:    Rodney Riley is a 78 y.o. male with a hx of  ASCAD, HTN and dyslipidemia.  He is here today for followup and is doing well.  He denies any of the anginal chest pain he had prior to his CABG.  He denies any SOB, DOE(except when he stops exercising for a while and then starts back), PND, orthopnea, LE edema, dizziness, palpitations or syncope. He is compliant with his meds and is tolerating meds with no SE.  He is  tolerating meds with no SE.    Prior CV studies:   The following studies were reviewed today:  Past Medical History:  Diagnosis Date   Arthritis    fingers mild   CAD (coronary artery disease)    s/p CABG 2003   Erectile dysfunction    HTN (hypertension) 07/18/2013   Hyperlipidemia    Hypertension    Low back pain    PONV (postoperative nausea and vomiting)    nausea and heache with ether 30 yrs ago   Pre-diabetes    Past Surgical History:  Procedure Laterality Date   CARDIAC SURGERY     stent x 1    COLONOSCOPY WITH PROPOFOL  N/A 06/08/2016   Procedure: COLONOSCOPY WITH PROPOFOL ;  Surgeon: Gladis MARLA Louder, MD;  Location: [facility];  Service: Endoscopy;  Laterality: N/A;   CORONARY ARTERY BYPASS GRAFT     x 3 or 4   INGUINAL HERNIA REPAIR Left    LUMBAR DISC SURGERY     x 2   TONSILLECTOMY       Current Meds  Medication Sig   aspirin  EC 81 MG tablet Take 81 mg daily by mouth.   atorvastatin  (LIPITOR) 40 MG tablet Take 1 tablet (40 mg total) by mouth daily.   cyanocobalamin (VITAMIN B12) 1000 MCG tablet 1 tablet Orally Once a day for 30 day(s)   losartan (COZAAR) 25 MG tablet Take 1 tablet by mouth daily.   metFORMIN (GLUCOPHAGE-XR) 500 MG 24 hr tablet Take 500 mg by mouth 2 (two) times daily. Taking 1000 mg in am and  500 mg in pm   metoprolol succinate (TOPROL-XL) 25 MG 24 hr tablet Take 25 mg by mouth daily.   Multiple Vitamin (MULTIVITAMIN WITH MINERALS) TABS Take 1 tablet by mouth daily.   Omega-3 Fatty Acids (FISH OIL) 1000 MG CAPS Take 3 capsules by mouth 3 (three) times daily after meals.   sildenafil (REVATIO) 20 MG tablet TAKE 3 TO 5 TABLETS BY MOUTH AS NEEDED FOR SEXUAL ACTIVITY for 20 day(s)   Vitamin D, Cholecalciferol, 50 MCG (2000 UT) CAPS Take 1 capsule by mouth daily.     Allergies:   Procaine   Social History   Tobacco Use   Smoking status: Never   Smokeless tobacco: Never  Vaping Use   Vaping status: Never Used  Substance Use Topics   Alcohol  use: Yes    Alcohol /week: 1.0 standard drink of alcohol     Types: 1 Glasses of wine per week    Comment: social    Drug use: No     Family Hx: The patient's family history includes Alzheimer's disease in his mother; COPD in his father; Cancer in his father; Diabetes in his mother;  Heart disease in his brother, father, and mother.  ROS:   Please see the history of present illness.     All other systems reviewed and are negative.   Labs/Other Tests and Data Reviewed:     EKG Interpretation Date/Time:  Wednesday March 21 2024 08:13:33 EDT Ventricular Rate:  61 PR Interval:  152 QRS Duration:  154 QT Interval:  430 QTC Calculation: 432 R Axis:   -41  Text Interpretation: Normal sinus rhythm Possible Left atrial enlargement Left axis deviation Right bundle branch block Septal infarct (cited on or before 14-Mar-2023) When compared with ECG of 14-Mar-2023 13:28, Inverted T waves have replaced nonspecific T wave abnormality in Inferior leads Confirmed by Shlomo Corning ([id number]) on 03/21/2024 8:34:17 AM   Recent Labs: No results found for requested labs within last 365 days.   Recent Lipid Panel Lab Results  Component Value Date/Time   CHOL 111 (L) 08/27/2015 08:56 AM   TRIG 49 08/27/2015 08:56 AM   HDL 45 08/27/2015 08:56 AM    CHOLHDL 2.5 08/27/2015 08:56 AM   LDLCALC 56 08/27/2015 08:56 AM    Wt Readings from Last 3 Encounters:  03/21/24 135 lb 3.2 oz (61.3 kg)  03/14/23 137 lb 6.4 oz (62.3 kg)  02/04/21 136 lb 6.4 oz (61.9 kg)     Objective:    Vital Signs:  BP 128/62   Pulse 61   Ht 5' 4 (1.626 m)   Wt 135 lb 3.2 oz (61.3 kg)   SpO2 99%   BMI 23.21 kg/m    GEN: Well nourished, well developed in no acute distress HEENT: Normal NECK: No JVD; No carotid bruits LYMPHATICS: No lymphadenopathy CARDIAC:RRR, no murmurs, rubs, gallops RESPIRATORY:  Clear to auscultation without rales, wheezing or rhonchi  ABDOMEN: Soft, non-tender, non-distended MUSCULOSKELETAL:  No edema; No deformity  SKIN: Warm and dry NEUROLOGIC:  Alert and oriented x 3 PSYCHIATRIC:  Normal affect   ASSESSMENT & PLAN:    1.  ASCAD - s/p remote CABG - He has done very well over the years and has not had any of the anginal symptoms he had prior to his CABG - he sill have some mild DOE when he firsts starts out walking if he has not been walking for a while associated with some very vague chest tightness but it goes away if he keeps walking and normally does not occur if he is consistent with his walking program. This sx is nothing like his typical angina.  - He will continue aspirin  81 mg daily, atorvastatin  40 mg daily, Toprol XL 25 mg daily with as needed refills  2.  HTN - His BP is adequately controlled today on exam - Continue losartan 25 mg daily, Toprol-XL 25 mg daily with as needed refills - I will get a copy of his last BMET from PCP  3.  HLD -LDL goal is < 70 -I have personally reviewed and interpreted outside labs performed by patient's PCP which showed LDL 52, HDL 52, ALT 24 on 6/81/7974 -Continue atorvastatin  40 mg daily and fish oil 3000 mg 3 times daily with as needed refills   Medication Adjustments/Labs and Tests Ordered: Current medicines are reviewed at length with the patient today.  Concerns regarding  medicines are outlined above.  Tests Ordered: Orders Placed This Encounter  Procedures   EKG 12-Lead   Medication Changes: No orders of the defined types were placed in this encounter.   Disposition:  Follow up in 1 year(s)  Signed, Adriyana Greenbaum  Shlomo, MD  03/21/2024 8:40 AM    [facility]

## 2024-03-21 ENCOUNTER — Encounter: Payer: Self-pay | Admitting: Cardiology

## 2024-03-21 ENCOUNTER — Ambulatory Visit: Attending: Cardiology | Admitting: Cardiology

## 2024-03-21 VITALS — BP 128/62 | HR 61 | Ht 64.0 in | Wt 135.2 lb

## 2024-03-21 DIAGNOSIS — I10 Essential (primary) hypertension: Secondary | ICD-10-CM | POA: Diagnosis not present

## 2024-03-21 DIAGNOSIS — I2510 Atherosclerotic heart disease of native coronary artery without angina pectoris: Secondary | ICD-10-CM | POA: Diagnosis not present

## 2024-03-21 DIAGNOSIS — E785 Hyperlipidemia, unspecified: Secondary | ICD-10-CM | POA: Diagnosis not present

## 2024-03-21 NOTE — Patient Instructions (Signed)
 Medication Instructions:  Your physician recommends that you continue on your current medications as directed. Please refer to the Current Medication list given to you today.  *If you need a refill on your cardiac medications before your next appointment, please call your pharmacy*  Lab Work: Please contact your PCP office and ask them to fax a copy of your recent BMET to our office at [phone].  If you have labs (blood work) drawn today and your tests are completely normal, you will receive your results only by: MyChart Message (if you have MyChart) OR A paper copy in the mail If you have any lab test that is abnormal or we need to change your treatment, we will call you to review the results.  Testing/Procedures: None.   Follow-Up: At [facility], you and your health needs are our priority.  As part of our continuing mission to provide you with exceptional heart care, our providers are all part of one team.  This team includes your primary Cardiologist (physician) and Advanced Practice Providers or APPs (Physician Assistants and Nurse Practitioners) who all work together to provide you with the care you need, when you need it.  Your next appointment:   1 year(s)  Provider:   Wilbert Bihari, MD

## 2024-05-15 DIAGNOSIS — H26491 Other secondary cataract, right eye: Secondary | ICD-10-CM | POA: Diagnosis not present

## 2024-05-15 DIAGNOSIS — H35371 Puckering of macula, right eye: Secondary | ICD-10-CM | POA: Diagnosis not present

## 2024-05-15 DIAGNOSIS — H18413 Arcus senilis, bilateral: Secondary | ICD-10-CM | POA: Diagnosis not present

## 2024-05-15 DIAGNOSIS — Z961 Presence of intraocular lens: Secondary | ICD-10-CM | POA: Diagnosis not present

## 2024-05-15 DIAGNOSIS — H26493 Other secondary cataract, bilateral: Secondary | ICD-10-CM | POA: Diagnosis not present

## 2024-05-23 DIAGNOSIS — Z9841 Cataract extraction status, right eye: Secondary | ICD-10-CM | POA: Diagnosis not present

## 2024-05-23 DIAGNOSIS — Z961 Presence of intraocular lens: Secondary | ICD-10-CM | POA: Diagnosis not present

## 2024-05-23 DIAGNOSIS — H2511 Age-related nuclear cataract, right eye: Secondary | ICD-10-CM | POA: Diagnosis not present

## 2024-06-27 DIAGNOSIS — E1165 Type 2 diabetes mellitus with hyperglycemia: Secondary | ICD-10-CM | POA: Diagnosis not present

## 2024-06-27 DIAGNOSIS — R634 Abnormal weight loss: Secondary | ICD-10-CM | POA: Diagnosis not present

## 2024-06-27 DIAGNOSIS — Z23 Encounter for immunization: Secondary | ICD-10-CM | POA: Diagnosis not present

## 2024-06-27 DIAGNOSIS — E119 Type 2 diabetes mellitus without complications: Secondary | ICD-10-CM | POA: Diagnosis not present

## 2024-06-27 DIAGNOSIS — I10 Essential (primary) hypertension: Secondary | ICD-10-CM | POA: Diagnosis not present

## 2024-06-27 DIAGNOSIS — H8112 Benign paroxysmal vertigo, left ear: Secondary | ICD-10-CM | POA: Diagnosis not present

## 2024-07-03 DIAGNOSIS — H26492 Other secondary cataract, left eye: Secondary | ICD-10-CM | POA: Diagnosis not present
